# Patient Record
Sex: Male | Born: 1940
Health system: Southern US, Community
[De-identification: ages and names within clinical notes are randomized; demographics above are authoritative.]

## PROBLEM LIST (undated history)

## (undated) DIAGNOSIS — M1990 Unspecified osteoarthritis, unspecified site: Secondary | ICD-10-CM

## (undated) DIAGNOSIS — G459 Transient cerebral ischemic attack, unspecified: Secondary | ICD-10-CM

## (undated) DIAGNOSIS — H3500 Unspecified background retinopathy: Secondary | ICD-10-CM

## (undated) DIAGNOSIS — E119 Type 2 diabetes mellitus without complications: Secondary | ICD-10-CM

## (undated) DIAGNOSIS — R52 Pain, unspecified: Secondary | ICD-10-CM

## (undated) DIAGNOSIS — I639 Cerebral infarction, unspecified: Secondary | ICD-10-CM

## (undated) HISTORY — PX: HERNIA REPAIR: SHX51

## (undated) HISTORY — PX: BACK SURGERY: SHX140

## (undated) HISTORY — DX: Unspecified background retinopathy: H35.00

---

## 2001-03-01 ENCOUNTER — Ambulatory Visit (HOSPITAL_COMMUNITY): Admission: RE | Admit: 2001-03-01 | Discharge: 2001-03-01 | Payer: Self-pay

## 2007-05-09 ENCOUNTER — Emergency Department: Payer: Self-pay | Admitting: Emergency Medicine

## 2016-04-12 ENCOUNTER — Emergency Department: Payer: Medicare Other

## 2016-04-12 ENCOUNTER — Inpatient Hospital Stay
Admission: EM | Admit: 2016-04-12 | Discharge: 2016-04-14 | DRG: 065 | Disposition: A | Discharge status: Home / Self Care | Payer: Medicare Other | Attending: Internal Medicine | Admitting: Internal Medicine

## 2016-04-12 ENCOUNTER — Encounter: Payer: Self-pay | Admitting: Emergency Medicine

## 2016-04-12 DIAGNOSIS — R4781 Slurred speech: Secondary | ICD-10-CM

## 2016-04-12 DIAGNOSIS — G459 Transient cerebral ischemic attack, unspecified: Secondary | ICD-10-CM | POA: Diagnosis present

## 2016-04-12 DIAGNOSIS — Z23 Encounter for immunization: Secondary | ICD-10-CM

## 2016-04-12 DIAGNOSIS — I161 Hypertensive emergency: Secondary | ICD-10-CM | POA: Diagnosis present

## 2016-04-12 DIAGNOSIS — K148 Other diseases of tongue: Secondary | ICD-10-CM | POA: Diagnosis present

## 2016-04-12 DIAGNOSIS — F1020 Alcohol dependence, uncomplicated: Secondary | ICD-10-CM | POA: Diagnosis present

## 2016-04-12 DIAGNOSIS — R262 Difficulty in walking, not elsewhere classified: Secondary | ICD-10-CM

## 2016-04-12 DIAGNOSIS — I6381 Other cerebral infarction due to occlusion or stenosis of small artery: Secondary | ICD-10-CM

## 2016-04-12 DIAGNOSIS — I10 Essential (primary) hypertension: Secondary | ICD-10-CM | POA: Diagnosis present

## 2016-04-12 DIAGNOSIS — E119 Type 2 diabetes mellitus without complications: Secondary | ICD-10-CM | POA: Diagnosis present

## 2016-04-12 DIAGNOSIS — I6302 Cerebral infarction due to thrombosis of basilar artery: Secondary | ICD-10-CM

## 2016-04-12 DIAGNOSIS — R29703 NIHSS score 3: Secondary | ICD-10-CM | POA: Diagnosis present

## 2016-04-12 DIAGNOSIS — R29810 Facial weakness: Secondary | ICD-10-CM

## 2016-04-12 DIAGNOSIS — I639 Cerebral infarction, unspecified: Secondary | ICD-10-CM | POA: Diagnosis not present

## 2016-04-12 LAB — PROTIME-INR
INR: 0.99
Prothrombin Time: 13.1 seconds (ref 11.4–15.2)

## 2016-04-12 LAB — COMPREHENSIVE METABOLIC PANEL
ALBUMIN: 3.8 g/dL (ref 3.5–5.0)
ALK PHOS: 64 U/L (ref 38–126)
ALT: 17 U/L (ref 17–63)
ANION GAP: 9 (ref 5–15)
AST: 20 U/L (ref 15–41)
BILIRUBIN TOTAL: 0.6 mg/dL (ref 0.3–1.2)
BUN: 17 mg/dL (ref 6–20)
CALCIUM: 9.1 mg/dL (ref 8.9–10.3)
CO2: 29 mmol/L (ref 22–32)
CREATININE: 1.14 mg/dL (ref 0.61–1.24)
Chloride: 103 mmol/L (ref 101–111)
GFR calc Af Amer: >60 mL/min (ref >60)
GFR calc non Af Amer: >60 mL/min (ref >60)
GLUCOSE: 198 mg/dL — AB (ref 65–99)
Potassium: 3.6 mmol/L (ref 3.5–5.1)
Sodium: 141 mmol/L (ref 135–145)
TOTAL PROTEIN: 6.8 g/dL (ref 6.5–8.1)

## 2016-04-12 LAB — CBC
HEMATOCRIT: 45.2 % (ref 40.0–52.0)
HEMOGLOBIN: 15.6 g/dL (ref 13.0–18.0)
MCH: 32.9 pg (ref 26.0–34.0)
MCHC: 34.6 g/dL (ref 32.0–36.0)
MCV: 95.2 fL (ref 80.0–100.0)
Platelets: 244 10*3/uL (ref 150–440)
RBC: 4.75 MIL/uL (ref 4.40–5.90)
RDW: 13.3 % (ref 11.5–14.5)
WBC: 6.9 10*3/uL (ref 3.8–10.6)

## 2016-04-12 LAB — APTT: aPTT: 26 seconds (ref 24–36)

## 2016-04-12 LAB — TROPONIN I: Troponin I: <0.03 ng/mL (ref <0.03)

## 2016-04-12 LAB — ETHANOL: Alcohol, Ethyl (B): 5 mg/dL — ABNORMAL HIGH (ref <5)

## 2016-04-12 LAB — GLUCOSE, CAPILLARY: Glucose-Capillary: 193 mg/dL — ABNORMAL HIGH (ref 65–99)

## 2016-04-12 MED ORDER — NITROGLYCERIN 2 % TD OINT
TOPICAL_OINTMENT | TRANSDERMAL | Status: AC
  Administered 2016-04-12: 1 [in_us] via TOPICAL
  Filled 2016-04-12: qty 1

## 2016-04-12 MED ORDER — ASPIRIN 81 MG PO CHEW
324.0000 mg | CHEWABLE_TABLET | Freq: Once | ORAL | Status: AC
Start: 2016-04-12 — End: 2016-04-12
  Administered 2016-04-12: 324 mg via ORAL
  Filled 2016-04-12: qty 4

## 2016-04-12 MED ORDER — ALTEPLASE 100 MG IV SOLR
INTRAVENOUS | Status: AC
  Filled 2016-04-12: qty 100

## 2016-04-12 MED ORDER — NITROGLYCERIN 2 % TD OINT
1.0000 [in_us] | TOPICAL_OINTMENT | Freq: Four times a day (QID) | TRANSDERMAL | Status: DC
  Administered 2016-04-12: 1 [in_us] via TOPICAL

## 2016-04-12 MED ORDER — LORAZEPAM 2 MG/ML IJ SOLN
1.0000 mg | Freq: Once | INTRAMUSCULAR | Status: AC
  Administered 2016-04-12: 1 mg via INTRAVENOUS
  Filled 2016-04-12: qty 1

## 2016-04-12 MED ORDER — LABETALOL HCL 5 MG/ML IV SOLN
10.0000 mg | Freq: Once | INTRAVENOUS | Status: AC
  Administered 2016-04-12: 10 mg via INTRAVENOUS

## 2016-04-12 MED ORDER — NITROGLYCERIN IN D5W 200-5 MCG/ML-% IV SOLN
0.0000 ug/min | INTRAVENOUS | Status: DC
  Administered 2016-04-12: 20 ug/min via INTRAVENOUS
  Filled 2016-04-12: qty 250

## 2016-04-12 MED ORDER — ASPIRIN 300 MG RE SUPP
RECTAL | Status: AC
  Filled 2016-04-12: qty 2

## 2016-04-12 NOTE — ED Provider Notes (Signed)
[facility] Emergency Department Provider Note  ____________________________________________  Time seen: Approximately 10:19 PM  I have reviewed the triage vital signs and the nursing notes.   HISTORY  Chief Complaint Code Stroke    HPI Eric Morris is a 75 y.o. male with a history of alcohol dependence but no other known medical problems presenting for slurred speech, left facial droop. The patient reports that at 6:15 PM he was pumping gas when he was noted to have slurred speech. He denies any other symptoms including headache, visual changes, numbness tingling or weakness, difficulty walking. He reports no alcohol since yesterday. He denies cocaine use. No known trauma. The patient is not anticoagulated. Upon arrival to the emergency department, code stroke was initiated for further assessment of this patient's symptoms.   History reviewed. No pertinent past medical history.  There are no active problems to display for this patient.   Past Surgical History:  Procedure Laterality Date  . BACK SURGERY    . HERNIA REPAIR        Allergies Review of patient's allergies indicates no known allergies.  No family history on file.  Social History Social History  Substance Use Topics  . Smoking status: Never Smoker  . Smokeless tobacco: Never Used  . Alcohol use 2.4 - 3.0 oz/week    4 - 5 Cans of beer per week    Review of Systems Constitutional: No fever/chills.No lightheadedness or syncope. No known trauma. Eyes: No visual changes. No blurred or double vision. ENT: No sore throat. No congestion or rhinorrhea. Cardiovascular: Denies chest pain. Denies palpitations. Respiratory: Denies shortness of breath.  No cough. Gastrointestinal: No abdominal pain.  No nausea, no vomiting.  No diarrhea.  No constipation. Genitourinary: Negative for dysuria. Musculoskeletal: Negative for back pain. Skin: Negative for rash. Neurological: Negative for  headaches. No focal numbness, tingling or weakness. Positive dysarthria. No difficult walking. No changes in vision.  10-point ROS otherwise negative.  ____________________________________________   PHYSICAL EXAM:  VITAL SIGNS: ED Triage Vitals [04/12/16 2214]  Enc Vitals Group     BP      Pulse      Resp      Temp      Temp src      SpO2      Weight 154 lb (69.9 kg)     Height 5\' 10"  (1.778 m)     Head Circumference      Peak Flow      Pain Score      Pain Loc      Pain Edu?      Excl. in [place]?     Constitutional: Alert and oriented. Chronically ill appearing but nontoxic.  Answers questions appropriately with intermittent slurred speech.. Markedly hypertensive. Eyes: Conjunctivae are normal.  EOMI. PERRLA. No scleral icterus. Head: Atraumatic. Nose: No congestion/rhinnorhea. Mouth/Throat: Mucous membranes are moist.  Neck: No stridor.  Supple.  No JVD. No meningismus. Cardiovascular: Normal rate, regular rhythm. No murmurs, rubs or gallops.  Respiratory: Normal respiratory effort.  No accessory muscle use or retractions. Lungs CTAB.  No wheezes, rales or ronchi. Gastrointestinal: Soft, nontender and nondistended.  No guarding or rebound.  No peritoneal signs. Musculoskeletal: No LE edema. No ttp in the calves or palpable cords.  Negative Homan's sign. Neurologic: Alert and oriented 3. Speech is intermittently slurred. Naming of the watch and watchband and repetition of no ifs, ands or buts are intact except for mild slurring. Left facial droop. Left smile asymmetry.  Left tongue deviation.  No pronator drift. 5 out of 5 grip, biceps, triceps, hip flexors, plantar flexion and dorsiflexion. Normal sensation to light touch in the bilateral upper and lower extremities, and face. Normal heel-to-shin. Skin:  Skin is warm, dry and intact. No rash noted. Psychiatric: Mood and affect are normal. Speech and behavior are normal.  Normal  judgement.  ____________________________________________   LABS (all labs ordered are listed, but only abnormal results are displayed)  Labs Reviewed  GLUCOSE, CAPILLARY - Abnormal; Notable for the following:       Result Value   Glucose-Capillary 193 (*)    All other components within normal limits  COMPREHENSIVE METABOLIC PANEL - Abnormal; Notable for the following:    Glucose, Bld 198 (*)    All other components within normal limits  ETHANOL - Abnormal; Notable for the following:    Alcohol, Ethyl (B) 5 (*)    All other components within normal limits  CBC  TROPONIN I  APTT  PROTIME-INR  URINALYSIS COMPLETEWITH MICROSCOPIC ([facility] ONLY)  URINE DRUG SCREEN, QUALITATIVE ([facility] ONLY)   ____________________________________________  EKG  ED ECG REPORT I, Eula Listen, the attending physician, personally viewed and interpreted this ECG.   Date: 04/12/2016  EKG Time: 2214  Rate: 62  Rhythm: normal sinus rhythm, LAFB  Axis: leftward  Intervals:none  ST&T Change: Nonspecific T-wave inversion in V1. No ST elevation.   ____________________________________________  RADIOLOGY  Ct Head Wo Contrast  Result Date: 04/12/2016 CLINICAL DATA:  Slurred speech and dizzy EXAM: CT HEAD WITHOUT CONTRAST TECHNIQUE: Contiguous axial images were obtained from the base of the skull through the vertex without intravenous contrast. COMPARISON:  None. FINDINGS: Brain: No evidence of acute infarction, hemorrhage, hydrocephalus, extra-axial collection or mass lesion/mass effect. Moderate periventricular white matter hypo densities most likely small vessel ischemic change. Old appearing lacune in the left sub insula. Small vessel disease extends into the right ganglial capsular region. Mild atrophy. Vascular: No hyperdense vessels. Mild calcifications in the carotid arteries at the skullbase. Skull: Normal. Negative for fracture or focal lesion. Sinuses/Orbits: Mild mucosal thickening in the  ethmoid sinuses. Orbits appear grossly intact. Other: None IMPRESSION: No CT evidence for acute intracranial abnormality. Moderate periventricular and right ganglial capsular small vessel ischemic changes. Old appearing left sub insular lacune. If continued clinical concern for CVA, MRI follow-up would be recommended. Critical Value/emergent results were called by telephone at the time of interpretation on 04/12/2016 at 10:25 pm to Dr. Mariea Clonts who was covering for Dr. Hinda Kehr , who verbally acknowledged these results. Electronically Signed   By: Donavan Foil M.D.   On: 04/12/2016 22:25   Dg Chest Portable 1 View  Result Date: 04/12/2016 CLINICAL DATA:  Slurred speech with facial droop, chest pain EXAM: PORTABLE CHEST 1 VIEW COMPARISON:  None. FINDINGS: The heart size and mediastinal contours are within normal limits. Both lungs are clear. The visualized skeletal structures are unremarkable. IMPRESSION: No active disease. Electronically Signed   By: Donavan Foil M.D.   On: 04/12/2016 23:20    ____________________________________________   PROCEDURES  Procedure(s) performed: None  Procedures  Critical Care performed: Yes ____________________________________________   INITIAL IMPRESSION / ASSESSMENT AND PLAN / ED COURSE  Pertinent labs & imaging results that were available during my care of the patient were reviewed by me and considered in my medical decision making (see chart for details).  75 y.o. male with alcohol dependence who does not regularly see a physician presenting with slurred speech, left facial droop  and left tongue deviation in the setting of market hypertension. The patient has a CT scan that does not show any acute stroke but does have some microvascular chronic disease. On my examination, the patient has intermittent slurred speech and left facial droop with left tongue deviation. The onset of his symptoms was approximate 6:15 PM which is greater than 4 hours ago. This  is a contraindication for TPA. In addition, the patient is markedly hypertensive which is also a contraindication at this time. I will plan to treat the patient with labetalol, and ordered an MRI for further evaluation. Other possible causes of slurred speech include alcohol intake, withdrawal from alcohol though his last rink was yesterday, atypical migraine. This patient will receive aspirin in the emergency department. He will be admitted to the hospital.  CRITICAL CARE Performed by: Eula Listen   Total critical care time: 40 minutes  Critical care time was exclusive of separately billable procedures and treating other patients.  Critical care was necessary to treat or prevent imminent or life-threatening deterioration.  Critical care was time spent personally by me on the following activities: development of treatment plan with patient and/or surrogate as well as nursing, discussions with consultants, evaluation of patient's response to treatment, examination of patient, obtaining history from patient or surrogate, ordering and performing treatments and interventions, ordering and review of laboratory studies, ordering and review of radiographic studies, pulse oximetry and re-evaluation of patient's condition.  ----------------------------------------- 10:32 PM on 04/12/2016 -----------------------------------------  The patient has been evaluated by Dr. Ron Parker, the patella neurologist on-call. His only finding was 1.4 slurred speech. He also agrees that the patient is not a candidate for TPA at this time, nor would he be a candidate for intra-arterial intervention. We will proceed with management of blood pressure, continued workup for his symptoms, and admission.  ____________________________________________  FINAL CLINICAL IMPRESSION(S) / ED DIAGNOSES  Final diagnoses:  Slurred speech  Facial droop  Tongue deviation  Hypertensive emergency    Clinical Course  Comment By  Time  The patient blood pressure after labetalol improved to the 180s, but then rebounded over 200. I have placed an inch of Nitropaste on his chest, and his current blood pressure is 198/98. His family is here, and his speech has improved on my examination. I'm awaiting the results of his laboratory studies for final disposition. Eula Listen, MD 10/25 2302      NEW MEDICATIONS STARTED DURING THIS VISIT:  New Prescriptions   No medications on file      Eula Listen, MD 04/12/16 2340

## 2016-04-12 NOTE — Progress Notes (Signed)
Pt resting comfortably. Son and daughter in room. Dr's conferring on next steps. CH let family know he is available through the night.   04/12/16 2215  Clinical Encounter Type  Visited With Patient and family together  Visit Type Initial  Referral From Nurse  Spiritual Encounters  Spiritual Needs Emotional  Stress Factors  Patient Stress Factors None identified  Family Stress Factors None identified  Advance Directives (For Healthcare)  Does patient have an advance directive? No  Would patient like information on creating an advanced directive? No - patient declined information

## 2016-04-12 NOTE — ED Notes (Signed)
Dr Mariea Clonts to bedside & report given

## 2016-04-12 NOTE — ED Triage Notes (Signed)
Patient ambulatory to triage with steady gait, accomp by son; pt reports slurred speech; pt placed in w/c and taken immed to CT; charge nurse notified; pt with slurred speech; some left sided facial droop noted; strong & equal grips noted, strong pedal pushes; pt denies c/o pain; st about 615pm was pumping gas and had sudden onset dizziness and slurred speech; pt denies any hx of same

## 2016-04-12 NOTE — ED Notes (Signed)
[facility] neurologlist consult in progress at this time.

## 2016-04-13 ENCOUNTER — Inpatient Hospital Stay
Admit: 2016-04-13 | Discharge: 2016-04-13 | Disposition: A | Discharge status: Home / Self Care | Payer: Medicare Other | Attending: Family Medicine | Admitting: Family Medicine

## 2016-04-13 ENCOUNTER — Inpatient Hospital Stay: Payer: Medicare Other

## 2016-04-13 ENCOUNTER — Encounter: Payer: Self-pay | Admitting: *Deleted

## 2016-04-13 DIAGNOSIS — I161 Hypertensive emergency: Secondary | ICD-10-CM | POA: Diagnosis present

## 2016-04-13 DIAGNOSIS — E119 Type 2 diabetes mellitus without complications: Secondary | ICD-10-CM | POA: Diagnosis present

## 2016-04-13 DIAGNOSIS — Z23 Encounter for immunization: Secondary | ICD-10-CM | POA: Diagnosis not present

## 2016-04-13 DIAGNOSIS — I639 Cerebral infarction, unspecified: Secondary | ICD-10-CM | POA: Diagnosis not present

## 2016-04-13 DIAGNOSIS — R4781 Slurred speech: Secondary | ICD-10-CM | POA: Diagnosis present

## 2016-04-13 DIAGNOSIS — K148 Other diseases of tongue: Secondary | ICD-10-CM | POA: Diagnosis present

## 2016-04-13 DIAGNOSIS — R29703 NIHSS score 3: Secondary | ICD-10-CM | POA: Diagnosis present

## 2016-04-13 DIAGNOSIS — F1020 Alcohol dependence, uncomplicated: Secondary | ICD-10-CM | POA: Diagnosis present

## 2016-04-13 DIAGNOSIS — I10 Essential (primary) hypertension: Secondary | ICD-10-CM | POA: Diagnosis present

## 2016-04-13 LAB — URINE DRUG SCREEN, QUALITATIVE (ARMC ONLY)
AMPHETAMINES, UR SCREEN: NOT DETECTED
Amphetamines, Ur Screen: NOT DETECTED
BARBITURATES, UR SCREEN: NOT DETECTED
Barbiturates, Ur Screen: NOT DETECTED
Benzodiazepine, Ur Scrn: NOT DETECTED
Benzodiazepine, Ur Scrn: NOT DETECTED
CANNABINOID 50 NG, UR ~~LOC~~: NOT DETECTED
CANNABINOID 50 NG, UR ~~LOC~~: NOT DETECTED
COCAINE METABOLITE, UR ~~LOC~~: NOT DETECTED
COCAINE METABOLITE, UR ~~LOC~~: NOT DETECTED
MDMA (ECSTASY) UR SCREEN: NOT DETECTED
MDMA (ECSTASY) UR SCREEN: NOT DETECTED
METHADONE SCREEN, URINE: NOT DETECTED
Methadone Scn, Ur: NOT DETECTED
Opiate, Ur Screen: NOT DETECTED
Opiate, Ur Screen: NOT DETECTED
PHENCYCLIDINE (PCP) UR S: NOT DETECTED
Phencyclidine (PCP) Ur S: NOT DETECTED
TRICYCLIC, UR SCREEN: NOT DETECTED
TRICYCLIC, UR SCREEN: NOT DETECTED

## 2016-04-13 LAB — URINALYSIS COMPLETE WITH MICROSCOPIC (ARMC ONLY)
Bacteria, UA: NONE SEEN
Bilirubin Urine: NEGATIVE
Bilirubin Urine: NEGATIVE
Glucose, UA: >500 mg/dL — AB
Ketones, ur: NEGATIVE mg/dL
LEUKOCYTES UA: NEGATIVE
NITRITE: NEGATIVE
Nitrite: NEGATIVE
PROTEIN: NEGATIVE mg/dL
PROTEIN: NEGATIVE mg/dL
SPECIFIC GRAVITY, URINE: 1.028 (ref 1.005–1.030)
SPECIFIC GRAVITY, URINE: 1.014 (ref 1.005–1.030)
pH: 5 (ref 5.0–8.0)
pH: 7 (ref 5.0–8.0)

## 2016-04-13 LAB — CBC
HEMATOCRIT: 39.7 % — AB (ref 40.0–52.0)
Hemoglobin: 13.8 g/dL (ref 13.0–18.0)
MCH: 33.3 pg (ref 26.0–34.0)
MCHC: 34.8 g/dL (ref 32.0–36.0)
MCV: 95.9 fL (ref 80.0–100.0)
PLATELETS: 214 10*3/uL (ref 150–440)
RBC: 4.14 MIL/uL — AB (ref 4.40–5.90)
RDW: 13.3 % (ref 11.5–14.5)
WBC: 7.3 10*3/uL (ref 3.8–10.6)

## 2016-04-13 LAB — COMPREHENSIVE METABOLIC PANEL
ALK PHOS: 49 U/L (ref 38–126)
ALT: 13 U/L — AB (ref 17–63)
AST: 15 U/L (ref 15–41)
Albumin: 3.3 g/dL — ABNORMAL LOW (ref 3.5–5.0)
Anion gap: 5 (ref 5–15)
BILIRUBIN TOTAL: 0.7 mg/dL (ref 0.3–1.2)
BUN: 17 mg/dL (ref 6–20)
CALCIUM: 8.4 mg/dL — AB (ref 8.9–10.3)
CHLORIDE: 105 mmol/L (ref 101–111)
CO2: 29 mmol/L (ref 22–32)
CREATININE: 1.02 mg/dL (ref 0.61–1.24)
Glucose, Bld: 172 mg/dL — ABNORMAL HIGH (ref 65–99)
Potassium: 3.4 mmol/L — ABNORMAL LOW (ref 3.5–5.1)
Sodium: 139 mmol/L (ref 135–145)
TOTAL PROTEIN: 6 g/dL — AB (ref 6.5–8.1)

## 2016-04-13 LAB — TROPONIN I
Troponin I: <0.03 ng/mL (ref <0.03)
Troponin I: <0.03 ng/mL (ref <0.03)

## 2016-04-13 LAB — GLUCOSE, CAPILLARY
GLUCOSE-CAPILLARY: 207 mg/dL — AB (ref 65–99)
GLUCOSE-CAPILLARY: 165 mg/dL — AB (ref 65–99)
Glucose-Capillary: 181 mg/dL — ABNORMAL HIGH (ref 65–99)

## 2016-04-13 LAB — LIPID PANEL
CHOLESTEROL: 163 mg/dL (ref 0–200)
HDL: 46 mg/dL (ref >40)
LDL Cholesterol: 95 mg/dL (ref 0–99)
TRIGLYCERIDES: 108 mg/dL (ref <150)
Total CHOL/HDL Ratio: 3.5 RATIO
VLDL: 22 mg/dL (ref 0–40)

## 2016-04-13 LAB — MRSA PCR SCREENING: MRSA by PCR: NEGATIVE

## 2016-04-13 MED ORDER — STROKE: EARLY STAGES OF RECOVERY BOOK: Freq: Once | Status: DC

## 2016-04-13 MED ORDER — INFLUENZA VAC SPLIT QUAD 0.5 ML IM SUSY
0.5000 mL | PREFILLED_SYRINGE | INTRAMUSCULAR | Status: AC
  Administered 2016-04-14: 0.5 mL via INTRAMUSCULAR
  Filled 2016-04-13: qty 0.5

## 2016-04-13 MED ORDER — ATORVASTATIN CALCIUM 20 MG PO TABS
40.0000 mg | ORAL_TABLET | Freq: Every day | ORAL | Status: DC
  Administered 2016-04-13 – 2016-04-14 (×2): 40 mg via ORAL
  Filled 2016-04-13 (×2): qty 2

## 2016-04-13 MED ORDER — ACETAMINOPHEN 650 MG RE SUPP: 650.0000 mg | RECTAL | Status: DC | PRN

## 2016-04-13 MED ORDER — ENOXAPARIN SODIUM 40 MG/0.4ML ~~LOC~~ SOLN
40.0000 mg | Freq: Every day | SUBCUTANEOUS | Status: DC
  Administered 2016-04-13 (×2): 40 mg via SUBCUTANEOUS
  Filled 2016-04-13 (×2): qty 0.4

## 2016-04-13 MED ORDER — ASPIRIN EC 81 MG PO TBEC
81.0000 mg | DELAYED_RELEASE_TABLET | Freq: Every day | ORAL | Status: DC
  Administered 2016-04-13 – 2016-04-14 (×2): 81 mg via ORAL
  Filled 2016-04-13 (×2): qty 1

## 2016-04-13 MED ORDER — PNEUMOCOCCAL VAC POLYVALENT 25 MCG/0.5ML IJ INJ
0.5000 mL | INJECTION | INTRAMUSCULAR | Status: AC
  Administered 2016-04-14: 08:00:00 0.5 mL via INTRAMUSCULAR
  Filled 2016-04-13: qty 0.5

## 2016-04-13 MED ORDER — POTASSIUM CHLORIDE CRYS ER 20 MEQ PO TBCR
40.0000 meq | EXTENDED_RELEASE_TABLET | Freq: Once | ORAL | Status: AC
Start: 2016-04-13 — End: 2016-04-13
  Administered 2016-04-13: 40 meq via ORAL
  Filled 2016-04-13: qty 2

## 2016-04-13 MED ORDER — ACETAMINOPHEN 325 MG PO TABS: 650.0000 mg | ORAL_TABLET | ORAL | Status: DC | PRN

## 2016-04-13 MED ORDER — INSULIN ASPART 100 UNIT/ML ~~LOC~~ SOLN
0.0000 [IU] | Freq: Three times a day (TID) | SUBCUTANEOUS | Status: DC
  Administered 2016-04-13 – 2016-04-14 (×2): 3 [IU] via SUBCUTANEOUS
  Administered 2016-04-14: 2 [IU] via SUBCUTANEOUS
  Filled 2016-04-13 (×2): qty 3
  Filled 2016-04-13: qty 2

## 2016-04-13 MED ORDER — GADOBENATE DIMEGLUMINE 529 MG/ML IV SOLN
15.0000 mL | Freq: Once | INTRAVENOUS | Status: AC | PRN
  Administered 2016-04-13: 14 mL via INTRAVENOUS

## 2016-04-13 MED ORDER — SODIUM CHLORIDE 0.9 % IV SOLN
INTRAVENOUS | Status: DC
  Administered 2016-04-13 – 2016-04-14 (×3): via INTRAVENOUS

## 2016-04-13 MED ORDER — SENNOSIDES-DOCUSATE SODIUM 8.6-50 MG PO TABS: 1.0000 | ORAL_TABLET | Freq: Every evening | ORAL | Status: DC | PRN

## 2016-04-13 NOTE — Plan of Care (Signed)
Problem: Education: Goal: Knowledge of patient specific risk factors addressed and post discharge goals established will improve Outcome: Progressing NIH 3. +MRI. Neuro status remains stable. VSS.

## 2016-04-13 NOTE — Care Management Obs Status (Signed)
[place] NOTIFICATION   Patient Details  Name: VERE PELLETT MRN: [id number] Date of Birth: [birth year]   Medicare Observation Status Notification Given:  Yes    Beau Fanny, RN 04/13/2016, 9:12 AM

## 2016-04-13 NOTE — Plan of Care (Signed)
Problem: Education: Goal: Knowledge of disease or condition will improve Outcome: Progressing Patient education on TIAs given to patient

## 2016-04-13 NOTE — Progress Notes (Signed)
SLP Cancellation Note  Patient Details Name: JAZARION MCMANNIS MRN: [id number] DOB: 19-Sep-1940   Cancelled treatment:       Reason Eval/Treat Not Completed: SLP screened, no needs identified, will sign off (no identified needs for Dysphagia. NSG consulted/agreed.) NSG stated pt consumed po's at breakfast meal as well as meds w/out deficits noted.  Pt's speech is c/b low volume and slightly mumbled speech. Pt indicated that low volume was "normal" for him. When he increased his volume he was 100% intelligible w/ no slurred speech noted. Noted he was able to verbally give his menu/meal requests for 2 meals (given multiple choices) w/ Dining services attendant w/out difficulty for pt or the attendant. Will f/u w/ family members and pt tomorrow to determine any speech needs. Pt agreed; NSG agreed.  Orinda Kenner, MS, CCC-SLP  Saturnino Liew 04/13/2016, 10:42 AM

## 2016-04-13 NOTE — Consult Note (Signed)
Referring Physician: Gouru    Chief Complaint: Difficulty with speech  HPI: Eric Morris is an 75 y.o. male who reports that while pumping gas yesterday he noted difficulty with speech. He noted no other symptoms.  Went to his son's and with no improvement in symptoms later presented to the ED.  Patient has a left facial droop as well that he and his son attribute to his previous facial surgery.  Patient was hypertensive on presentation.  Has not seen a physician in quite some time.  Initial NIHSS of 3.    Date last known well: Date: 04/12/2016 Time last known well: Time: 18:15 tPA Given: No: Minimal symptoms  History reviewed. No pertinent past medical history.  Past Surgical History:  Procedure Laterality Date  . BACK SURGERY    . HERNIA REPAIR      Family history: Son alive and well  Social History:  reports that he quit smoking about 10 years ago. His smoking use included Cigarettes. He has a 22.50 pack-year smoking history. His smokeless tobacco use includes Chew. He reports that he drinks about 2.4 - 3.0 oz of alcohol per week . His drug history is not on file.  Allergies: No Known Allergies  Medications:  I have reviewed the patient's current medications. Prior to Admission:  No prescriptions prior to admission.   Scheduled: .  stroke: mapping our early stages of recovery book   Does not apply Once  . aspirin EC  81 mg Oral Daily  . atorvastatin  40 mg Oral q1800  . enoxaparin (LOVENOX) injection  40 mg Subcutaneous QHS  . [START ON 04/14/2016] Influenza vac split quadrivalent PF  0.5 mL Intramuscular Tomorrow-1000  . [START ON 04/14/2016] pneumococcal 23 valent vaccine  0.5 mL Intramuscular Tomorrow-1000    ROS: History obtained from the patient  General ROS: negative for - chills, fatigue, fever, night sweats, weight gain or weight loss Psychological ROS: negative for - behavioral disorder, hallucinations, memory difficulties, mood swings or suicidal  ideation Ophthalmic ROS: negative for - blurry vision, double vision, eye pain or loss of vision ENT ROS: negative for - epistaxis, nasal discharge, oral lesions, sore throat, tinnitus or vertigo Allergy and Immunology ROS: negative for - hives or itchy/watery eyes Hematological and Lymphatic ROS: negative for - bleeding problems, bruising or swollen lymph nodes Endocrine ROS: negative for - galactorrhea, hair pattern changes, polydipsia/polyuria or temperature intolerance Respiratory ROS: negative for - cough, hemoptysis, shortness of breath or wheezing Cardiovascular ROS: negative for - chest pain, dyspnea on exertion, edema or irregular heartbeat Gastrointestinal ROS: negative for - abdominal pain, diarrhea, hematemesis, nausea/vomiting or stool incontinence Genito-Urinary ROS: negative for - dysuria, hematuria, incontinence or urinary frequency/urgency Musculoskeletal ROS: negative for - joint swelling or muscular weakness Neurological ROS: as noted in HPI Dermatological ROS: negative for rash and skin lesion changes  Physical Examination: Blood pressure (!) 167/80, pulse (!) 51, temperature 97.9 F (36.6 C), temperature source Oral, resp. rate 14, height 5\' 10"  (1.778 m), weight 64 kg (141 lb 1.5 oz), SpO2 97 %.  HEENT-  Normocephalic, no lesions, without obvious abnormality.  Normal external eye and conjunctiva.  Normal TM's bilaterally.  Normal auditory canals and external ears. Normal external nose, mucus membranes and septum.  Normal pharynx. Cardiovascular- S1, S2 normal, pulses palpable throughout   Lungs- chest clear, no wheezing, rales, normal symmetric air entry Abdomen- soft, non-tender; bowel sounds normal; no masses,  no organomegaly Extremities- no edema Lymph-no adenopathy palpable Musculoskeletal-no joint tenderness, deformity  or swelling Skin-warm and dry, no hyperpigmentation, vitiligo, or suspicious lesions  Neurological Examination Mental Status: Alert, oriented,  thought content appropriate.  Speech fluent without evidence of aphasia but mild dysarthria.  Able to follow 3 step commands without difficulty. Cranial Nerves: II: Discs flat bilaterally; Visual fields grossly normal, pupils equal, round, reactive to light and accommodation III,IV, VI: ptosis not present, extra-ocular motions intact bilaterally V,VII: left facial droop, facial light touch sensation normal bilaterally VIII: hearing normal bilaterally IX,X: gag reflex present XI: bilateral shoulder shrug XII: midline tongue extension Motor: Right : Upper extremity   5/5    Left:     Upper extremity   5/5  Lower extremity   5/5     Lower extremity   5/5 Tone and bulk:normal tone throughout; no atrophy noted Sensory: Pinprick and light touch intact throughout, bilaterally Deep Tendon Reflexes: 2+ and symmetric with absent AJ's bilaterally Plantars: Right: downgoing   Left: equivical Cerebellar: Normal finger-to-nose and normal heel-to-shin testing bilaterally Gait: not tested due to safety concerns    Laboratory Studies:  Basic Metabolic Panel:  Recent Labs Lab 04/12/16 2216 04/13/16 0340  NA 141 139  K 3.6 3.4*  CL 103 105  CO2 29 29  GLUCOSE 198* 172*  BUN 17 17  CREATININE 1.14 1.02  CALCIUM 9.1 8.4*    Liver Function Tests:  Recent Labs Lab 04/12/16 2216 04/13/16 0340  AST 20 15  ALT 17 13*  ALKPHOS 64 49  BILITOT 0.6 0.7  PROT 6.8 6.0*  ALBUMIN 3.8 3.3*   No results for input(s): LIPASE, AMYLASE in the last 168 hours. No results for input(s): AMMONIA in the last 168 hours.  CBC:  Recent Labs Lab 04/12/16 2216 04/13/16 0340  WBC 6.9 7.3  HGB 15.6 13.8  HCT 45.2 39.7*  MCV 95.2 95.9  PLT 244 214    Cardiac Enzymes:  Recent Labs Lab 04/12/16 2216 04/13/16 0340 04/13/16 0831  TROPONINI <0.03 <0.03 <0.03    BNP: Invalid input(s): POCBNP  CBG:  Recent Labs Lab 04/12/16 2211 04/13/16 0226  GLUCAP 193* 181*    Microbiology: Results  for orders placed or performed during the hospital encounter of 04/12/16  MRSA PCR Screening     Status: None   Collection Time: 04/13/16  2:20 AM  Result Value Ref Range Status   MRSA by PCR NEGATIVE NEGATIVE Final    Comment:        The GeneXpert MRSA Assay (FDA approved for NASAL specimens only), is one component of a comprehensive MRSA colonization surveillance program. It is not intended to diagnose MRSA infection nor to guide or monitor treatment for MRSA infections.     Coagulation Studies:  Recent Labs  04/12/16 2216  LABPROT 13.1  INR 0.99    Urinalysis:  Recent Labs Lab 04/12/16 2349 04/13/16 0651  COLORURINE STRAW* YELLOW*  LABSPEC 1.014 1.028  PHURINE 7.0 5.0  GLUCOSEU >500* >500*  HGBUR 1+* 1+*  BILIRUBINUR NEGATIVE NEGATIVE  KETONESUR NEGATIVE TRACE*  PROTEINUR NEGATIVE NEGATIVE  NITRITE NEGATIVE NEGATIVE  LEUKOCYTESUR TRACE* NEGATIVE    Lipid Panel:    Component Value Date/Time   CHOL 163 04/13/2016 0340   TRIG 108 04/13/2016 0340   HDL 46 04/13/2016 0340   CHOLHDL 3.5 04/13/2016 0340   VLDL 22 04/13/2016 0340   LDLCALC 95 04/13/2016 0340    HgbA1C: No results found for: HGBA1C  Urine Drug Screen:     Component Value Date/Time   LABOPIA NONE DETECTED 04/13/2016 0651  COCAINSCRNUR NONE DETECTED 04/13/2016 0651   LABBENZ NONE DETECTED 04/13/2016 0651   AMPHETMU NONE DETECTED 04/13/2016 0651   THCU NONE DETECTED 04/13/2016 0651   LABBARB NONE DETECTED 04/13/2016 0651    Alcohol Level:  Recent Labs Lab 04/12/16 2216  ETH 5*     Imaging: Ct Head Wo Contrast  Result Date: 04/12/2016 CLINICAL DATA:  Slurred speech and dizzy EXAM: CT HEAD WITHOUT CONTRAST TECHNIQUE: Contiguous axial images were obtained from the base of the skull through the vertex without intravenous contrast. COMPARISON:  None. FINDINGS: Brain: No evidence of acute infarction, hemorrhage, hydrocephalus, extra-axial collection or mass lesion/mass effect. Moderate  periventricular white matter hypo densities most likely small vessel ischemic change. Old appearing lacune in the left sub insula. Small vessel disease extends into the right ganglial capsular region. Mild atrophy. Vascular: No hyperdense vessels. Mild calcifications in the carotid arteries at the skullbase. Skull: Normal. Negative for fracture or focal lesion. Sinuses/Orbits: Mild mucosal thickening in the ethmoid sinuses. Orbits appear grossly intact. Other: None IMPRESSION: No CT evidence for acute intracranial abnormality. Moderate periventricular and right ganglial capsular small vessel ischemic changes. Old appearing left sub insular lacune. If continued clinical concern for CVA, MRI follow-up would be recommended. Critical Value/emergent results were called by telephone at the time of interpretation on 04/12/2016 at 10:25 pm to Dr. Mariea Clonts who was covering for Dr. Hinda Kehr , who verbally acknowledged these results. Electronically Signed   By: Donavan Foil M.D.   On: 04/12/2016 22:25   Mr Jodene Nam Head Wo Contrast  Result Date: 04/13/2016 CLINICAL DATA:  Slurred speech, dizziness LEFT facial droop while pumping gas at 6:15 p.m. EXAM: MRI HEAD WITHOUT CONTRAST MRA HEAD WITHOUT CONTRAST MRA NECK WITHOUT AND WITH CONTRAST TECHNIQUE: Multiplanar, multiecho pulse sequences of the brain and surrounding structures were obtained without intravenous contrast. Angiographic images of the Circle of Willis were obtained using MRA technique without intravenous contrast. Angiographic images of the neck were obtained using MRA technique without and with intravenous contrast. Carotid stenosis measurements (when applicable) are obtained utilizing NASCET criteria, using the distal internal carotid diameter as the denominator. CONTRAST:  15mL MULTIHANCE GADOBENATE DIMEGLUMINE 529 MG/ML IV SOLN COMPARISON:  CT HEAD April 12, 2016 FINDINGS: MRI HEAD FINDINGS BRAIN: 11 x 19 mm reduced diffusion LEFT caudate body to  putaminal/lenticulostriate nucleus. Corresponding low ADC values. Old RIGHT basal ganglia infarct with mineralization, no susceptibility artifact to suggest hemorrhage. Mild ex vacuo dilatation RIGHT lateral ventricle, ventricles and sulci are overall normal for patient's age. Patchy to confluent supratentorial white matter FLAIR T2 hyperintensities without midline shift, mass effect or masses. No abnormal extra-axial fluid collections. VASCULAR: Normal major intracranial vascular flow voids present at skull base. SKULL AND UPPER CERVICAL SPINE: No abnormal sellar expansion. No suspicious calvarial bone marrow signal. Craniocervical junction maintained. SINUSES/ORBITS: Bilateral posterior ethmoid mucosal thickening trace LEFT maxillary sinus mucosal thickening. Mastoid air cells are well aerated. The included ocular globes and orbital contents are non-suspicious. OTHER: Patient is edentulous. MRA HEAD FINDINGS- Mild motion degraded examination. ANTERIOR CIRCULATION: Normal flow related enhancement of the included cervical, petrous, cavernous and supraclinoid internal carotid arteries. Patent anterior communicating artery. Normal flow related enhancement of the anterior and middle cerebral arteries, including distal segments. No large vessel occlusion, high-grade stenosis, abnormal luminal irregularity, aneurysm. POSTERIOR CIRCULATION: LEFT vertebral artery is dominant. Basilar artery is patent, with normal flow related enhancement of the main branch vessels. Normal flow related enhancement of the posterior cerebral arteries. No large vessel occlusion, high-grade  stenosis, abnormal luminal irregularity, aneurysm. MRA NECK FINDINGS Source images and MIP image were reviewed. No T1 shortening along the course of the artery's. The common carotid arteries are widely patent bilaterally. The carotid bifurcation is patent bilaterally and there is no significant carotid stenosis. Both vertebral arteries and patent to the  basilar without significant vertebral stenosis. There is no evidence for atherosclerosis or flow limiting stenosis. IMPRESSION: MRI HEAD: Acute 11 x 19 mm LEFT lenticulostriate/basal ganglia nonhemorrhagic infarct. Old RIGHT basal ganglia infarct. Moderate chronic small vessel ischemic disease. MRA HEAD: Motion degraded examination without emergent large vessel occlusion or high-grade stenosis. MRA NECK: Normal. Electronically Signed   By: Elon Alas M.D.   On: 04/13/2016 02:38   Mr Angiogram Neck W Or Wo Contrast  Result Date: 04/13/2016 CLINICAL DATA:  Slurred speech, dizziness LEFT facial droop while pumping gas at 6:15 p.m. EXAM: MRI HEAD WITHOUT CONTRAST MRA HEAD WITHOUT CONTRAST MRA NECK WITHOUT AND WITH CONTRAST TECHNIQUE: Multiplanar, multiecho pulse sequences of the brain and surrounding structures were obtained without intravenous contrast. Angiographic images of the Circle of Willis were obtained using MRA technique without intravenous contrast. Angiographic images of the neck were obtained using MRA technique without and with intravenous contrast. Carotid stenosis measurements (when applicable) are obtained utilizing NASCET criteria, using the distal internal carotid diameter as the denominator. CONTRAST:  22mL MULTIHANCE GADOBENATE DIMEGLUMINE 529 MG/ML IV SOLN COMPARISON:  CT HEAD April 12, 2016 FINDINGS: MRI HEAD FINDINGS BRAIN: 11 x 19 mm reduced diffusion LEFT caudate body to putaminal/lenticulostriate nucleus. Corresponding low ADC values. Old RIGHT basal ganglia infarct with mineralization, no susceptibility artifact to suggest hemorrhage. Mild ex vacuo dilatation RIGHT lateral ventricle, ventricles and sulci are overall normal for patient's age. Patchy to confluent supratentorial white matter FLAIR T2 hyperintensities without midline shift, mass effect or masses. No abnormal extra-axial fluid collections. VASCULAR: Normal major intracranial vascular flow voids present at skull base.  SKULL AND UPPER CERVICAL SPINE: No abnormal sellar expansion. No suspicious calvarial bone marrow signal. Craniocervical junction maintained. SINUSES/ORBITS: Bilateral posterior ethmoid mucosal thickening trace LEFT maxillary sinus mucosal thickening. Mastoid air cells are well aerated. The included ocular globes and orbital contents are non-suspicious. OTHER: Patient is edentulous. MRA HEAD FINDINGS- Mild motion degraded examination. ANTERIOR CIRCULATION: Normal flow related enhancement of the included cervical, petrous, cavernous and supraclinoid internal carotid arteries. Patent anterior communicating artery. Normal flow related enhancement of the anterior and middle cerebral arteries, including distal segments. No large vessel occlusion, high-grade stenosis, abnormal luminal irregularity, aneurysm. POSTERIOR CIRCULATION: LEFT vertebral artery is dominant. Basilar artery is patent, with normal flow related enhancement of the main branch vessels. Normal flow related enhancement of the posterior cerebral arteries. No large vessel occlusion, high-grade stenosis, abnormal luminal irregularity, aneurysm. MRA NECK FINDINGS Source images and MIP image were reviewed. No T1 shortening along the course of the artery's. The common carotid arteries are widely patent bilaterally. The carotid bifurcation is patent bilaterally and there is no significant carotid stenosis. Both vertebral arteries and patent to the basilar without significant vertebral stenosis. There is no evidence for atherosclerosis or flow limiting stenosis. IMPRESSION: MRI HEAD: Acute 11 x 19 mm LEFT lenticulostriate/basal ganglia nonhemorrhagic infarct. Old RIGHT basal ganglia infarct. Moderate chronic small vessel ischemic disease. MRA HEAD: Motion degraded examination without emergent large vessel occlusion or high-grade stenosis. MRA NECK: Normal. Electronically Signed   By: Elon Alas M.D.   On: 04/13/2016 02:38   Mr Brain Wo Contrast  Result  Date: 04/13/2016 CLINICAL DATA:  Slurred speech, dizziness LEFT facial droop while pumping gas at 6:15 p.m. EXAM: MRI HEAD WITHOUT CONTRAST MRA HEAD WITHOUT CONTRAST MRA NECK WITHOUT AND WITH CONTRAST TECHNIQUE: Multiplanar, multiecho pulse sequences of the brain and surrounding structures were obtained without intravenous contrast. Angiographic images of the Circle of Willis were obtained using MRA technique without intravenous contrast. Angiographic images of the neck were obtained using MRA technique without and with intravenous contrast. Carotid stenosis measurements (when applicable) are obtained utilizing NASCET criteria, using the distal internal carotid diameter as the denominator. CONTRAST:  18mL MULTIHANCE GADOBENATE DIMEGLUMINE 529 MG/ML IV SOLN COMPARISON:  CT HEAD April 12, 2016 FINDINGS: MRI HEAD FINDINGS BRAIN: 11 x 19 mm reduced diffusion LEFT caudate body to putaminal/lenticulostriate nucleus. Corresponding low ADC values. Old RIGHT basal ganglia infarct with mineralization, no susceptibility artifact to suggest hemorrhage. Mild ex vacuo dilatation RIGHT lateral ventricle, ventricles and sulci are overall normal for patient's age. Patchy to confluent supratentorial white matter FLAIR T2 hyperintensities without midline shift, mass effect or masses. No abnormal extra-axial fluid collections. VASCULAR: Normal major intracranial vascular flow voids present at skull base. SKULL AND UPPER CERVICAL SPINE: No abnormal sellar expansion. No suspicious calvarial bone marrow signal. Craniocervical junction maintained. SINUSES/ORBITS: Bilateral posterior ethmoid mucosal thickening trace LEFT maxillary sinus mucosal thickening. Mastoid air cells are well aerated. The included ocular globes and orbital contents are non-suspicious. OTHER: Patient is edentulous. MRA HEAD FINDINGS- Mild motion degraded examination. ANTERIOR CIRCULATION: Normal flow related enhancement of the included cervical, petrous, cavernous  and supraclinoid internal carotid arteries. Patent anterior communicating artery. Normal flow related enhancement of the anterior and middle cerebral arteries, including distal segments. No large vessel occlusion, high-grade stenosis, abnormal luminal irregularity, aneurysm. POSTERIOR CIRCULATION: LEFT vertebral artery is dominant. Basilar artery is patent, with normal flow related enhancement of the main branch vessels. Normal flow related enhancement of the posterior cerebral arteries. No large vessel occlusion, high-grade stenosis, abnormal luminal irregularity, aneurysm. MRA NECK FINDINGS Source images and MIP image were reviewed. No T1 shortening along the course of the artery's. The common carotid arteries are widely patent bilaterally. The carotid bifurcation is patent bilaterally and there is no significant carotid stenosis. Both vertebral arteries and patent to the basilar without significant vertebral stenosis. There is no evidence for atherosclerosis or flow limiting stenosis. IMPRESSION: MRI HEAD: Acute 11 x 19 mm LEFT lenticulostriate/basal ganglia nonhemorrhagic infarct. Old RIGHT basal ganglia infarct. Moderate chronic small vessel ischemic disease. MRA HEAD: Motion degraded examination without emergent large vessel occlusion or high-grade stenosis. MRA NECK: Normal. Electronically Signed   By: Elon Alas M.D.   On: 04/13/2016 02:38   Dg Chest Portable 1 View  Result Date: 04/12/2016 CLINICAL DATA:  Slurred speech with facial droop, chest pain EXAM: PORTABLE CHEST 1 VIEW COMPARISON:  None. FINDINGS: The heart size and mediastinal contours are within normal limits. Both lungs are clear. The visualized skeletal structures are unremarkable. IMPRESSION: No active disease. Electronically Signed   By: Donavan Foil M.D.   On: 04/12/2016 23:20    Assessment: 75 y.o. male with a history of ETOH abuse who presents with dysarthria.  MRI of the brain reviewed and reveals an acute left BG infarct.   MRA shows no significant abnormalities.  Patient on no medications at home.  LDL 95.  Further work up recommended.    Stroke Risk Factors - hypertension  Plan: 1. HgbA1c 2. BP control 3. PT consult, OT consult, Speech consult 4. Echocardiogram 5. Prophylactic therapy-Antiplatelet med: Aspirin - dose  325mg  daily 6. NPO until RN stroke swallow screen 7. Telemetry monitoring 8. Frequent neuro checks 9. Statin for lipid management with target LDL<70.  Alexis Goodell, MD Neurology [phone] 04/13/2016, 12:49 PM

## 2016-04-13 NOTE — Progress Notes (Signed)
Inpatient Diabetes Program Recommendations  AACE/ADA: New Consensus Statement on Inpatient Glycemic Control (2015)  Target Ranges:  Prepandial:   less than 140 mg/dL      Peak postprandial:   less than 180 mg/dL (1-2 hours)      Critically ill patients:  140 - 180 mg/dL   Lab Results  Component Value Date   GLUCAP 181 (H) 04/13/2016    Review of Glycemic Control  Results for YARET, PONT (MRN [id number]) as of 04/13/2016 10:30  Ref. Range 04/12/2016 22:11 04/13/2016 02:26  Glucose-Capillary Latest Ref Range: 65 - 99 mg/dL 193 (H) 181 (H)    Diabetes history: none noted, A1C pending Outpatient Diabetes medications: none Current orders for Inpatient glycemic control: none  Inpatient Diabetes Program Recommendations:  Please consider changing diet to carb modified/heart healthy  Consider Novolog 0-9 units (sensitive correction) tid and Novolog 0-5 units qhs   Gentry Fitz, RN, [place], [place], CDE Diabetes Coordinator Inpatient Diabetes Program  [phone] (Team Pager) [phone] ([place]) 04/13/2016 10:32 AM

## 2016-04-13 NOTE — H&P (Signed)
[place] @ [facility] Admission History and Physical [employer], D.O.  ---------------------------------------------------------------------------------------------------------------------   PATIENT NAME: Eric Morris MR#: [id number] DATE OF BIRTH: [birth year] DATE OF ADMISSION: 04/12/2016 PRIMARY CARE PHYSICIAN: No PCP Per Patient  REQUESTING/REFERRING PHYSICIAN: ED Dr. Mariea Clonts  CHIEF COMPLAINT: Chief Complaint  Patient presents with  . Code Stroke    HISTORY OF PRESENT ILLNESS: [name] is a 75 y.o. male with a known history of Alcohol dependence was in a usual state of health until this evening at 6:15 when he noticed slurred speech and difficulty with facial expressions. Symptoms began about 6:15 while the patient was pumping gas. He reports that he then drove to his son's house which is about 20 minutes away. When symptoms did not resolve after some time he presented to the emergency department. Patient states that he has not seen a doctor in many years. He did have a facial surgery 2 years ago but other than that has not kept up with his healthcare maintenance.  Otherwise there has been no change in status. Patient has been taking medication as prescribed and there has been no recent change in medication or diet.  There has been no recent illness, travel or sick contacts.    Patient denies fevers/chills, weakness, dizziness, chest pain, shortness of breath, N/V/C/D, abdominal pain, dysuria/frequency, changes in mental status.   EMS/ED COURSE:   Patient received aspirin  PAST MEDICAL HISTORY: History reviewed. No pertinent past medical history.  Alcohol dependence  PAST SURGICAL HISTORY: Past Surgical History:  Procedure Laterality Date  . BACK SURGERY    . HERNIA REPAIR        SOCIAL HISTORY: Social History  Substance Use Topics  . Smoking status: Never Smoker  . Smokeless tobacco: Never Used  . Alcohol use 2.4 - 3.0 oz/week    4 - 5 Cans  of beer per week      FAMILY HISTORY: No family history on file.   MEDICATIONS AT HOME: Prior to Admission medications   Not on File      DRUG ALLERGIES: No Known Allergies   REVIEW OF SYSTEMS: CONSTITUTIONAL: No fever/chills, fatigue, weakness, weight gain/loss, headache EYES: No blurry or double vision. ENT: No tinnitus, postnasal drip, redness or soreness of the oropharynx.Positive slurred speech RESPIRATORY: No cough, wheeze, hemoptysis, dyspnea. CARDIOVASCULAR: No chest pain, orthopnea, palpitations, syncope. GASTROINTESTINAL: No nausea, vomiting, constipation, diarrhea, abdominal pain, hematemesis, melena or hematochezia. GENITOURINARY: No dysuria or hematuria. ENDOCRINE: No polyuria or nocturia. No heat or cold intolerance. HEMATOLOGY: No anemia, bruising, bleeding. INTEGUMENTARY: No rashes, ulcers, lesions. MUSCULOSKELETAL: No arthritis, swelling, gout. NEUROLOGIC: No numbness, tingling, weakness or ataxia. No seizure-type activity. Positive slurred speech PSYCHIATRIC: No anxiety, depression, insomnia.  PHYSICAL EXAMINATION: VITAL SIGNS: Blood pressure (!) 153/93, pulse 63, resp. rate 18, height 5\' 10"  (1.778 m), weight 69.9 kg (154 lb), SpO2 97 %.  GENERAL: 75 y.o.-year-old white male patient, well-developed, well-nourished lying in the bed in no acute distress.  Pleasant and cooperative.   HEENT: Head atraumatic, normocephalic. Pupils equal, round, reactive to light and accommodation. No scleral icterus. Extraocular muscles intact. Nares are patent. Oropharynx is clear. Mucus membranes moist. NECK: Supple, full range of motion. No JVD, no bruit heard. No thyroid enlargement, no tenderness, no cervical lymphadenopathy. CHEST: Normal breath sounds bilaterally. No wheezing, rales, rhonchi or crackles. No use of accessory muscles of respiration.  No reproducible chest wall tenderness.  CARDIOVASCULAR: S1, S2 normal. No murmurs, rubs, or gallops. Cap refill <2  seconds.  ABDOMEN: Soft, nontender, nondistended. No rebound, guarding, rigidity. Normoactive bowel sounds present in all four quadrants. No organomegaly or mass. EXTREMITIES: Full range of motion. No pedal edema, cyanosis, or clubbing. NEUROLOGIC: Cranial nerves II through XII are grossly intact with no focal sensorimotor deficit. With the exception of mildly slurred speech. There is loss of the nasolabial fold on the left but no droop, no asymmetry of his smile and no left tongue deviation. Muscle strength 5/5 in all extremities. Sensation intact. Gait not checked. Cerebellar signs are intact. PSYCHIATRIC: The patient is alert and oriented x 3. Normal affect, mood, thought content. SKIN: Warm, dry, and intact without obvious rash, lesion, or ulcer.  LABORATORY PANEL:  CBC  Recent Labs Lab 04/12/16 2216  WBC 6.9  HGB 15.6  HCT 45.2  PLT 244   ----------------------------------------------------------------------------------------------------------------- Chemistries  Recent Labs Lab 04/12/16 2216  NA 141  K 3.6  CL 103  CO2 29  GLUCOSE 198*  BUN 17  CREATININE 1.14  CALCIUM 9.1  AST 20  ALT 17  ALKPHOS 64  BILITOT 0.6   ------------------------------------------------------------------------------------------------------------------ Cardiac Enzymes  Recent Labs Lab 04/12/16 2216  TROPONINI <0.03   ------------------------------------------------------------------------------------------------------------------  RADIOLOGY: Ct Head Wo Contrast  Result Date: 04/12/2016 CLINICAL DATA:  Slurred speech and dizzy EXAM: CT HEAD WITHOUT CONTRAST TECHNIQUE: Contiguous axial images were obtained from the base of the skull through the vertex without intravenous contrast. COMPARISON:  None. FINDINGS: Brain: No evidence of acute infarction, hemorrhage, hydrocephalus, extra-axial collection or mass lesion/mass effect. Moderate periventricular white matter hypo densities most  likely small vessel ischemic change. Old appearing lacune in the left sub insula. Small vessel disease extends into the right ganglial capsular region. Mild atrophy. Vascular: No hyperdense vessels. Mild calcifications in the carotid arteries at the skullbase. Skull: Normal. Negative for fracture or focal lesion. Sinuses/Orbits: Mild mucosal thickening in the ethmoid sinuses. Orbits appear grossly intact. Other: None IMPRESSION: No CT evidence for acute intracranial abnormality. Moderate periventricular and right ganglial capsular small vessel ischemic changes. Old appearing left sub insular lacune. If continued clinical concern for CVA, MRI follow-up would be recommended. Critical Value/emergent results were called by telephone at the time of interpretation on 04/12/2016 at 10:25 pm to Dr. Mariea Clonts who was covering for Dr. Hinda Kehr , who verbally acknowledged these results. Electronically Signed   By: Donavan Foil M.D.   On: 04/12/2016 22:25   Dg Chest Portable 1 View  Result Date: 04/12/2016 CLINICAL DATA:  Slurred speech with facial droop, chest pain EXAM: PORTABLE CHEST 1 VIEW COMPARISON:  None. FINDINGS: The heart size and mediastinal contours are within normal limits. Both lungs are clear. The visualized skeletal structures are unremarkable. IMPRESSION: No active disease. Electronically Signed   By: Donavan Foil M.D.   On: 04/12/2016 23:20    EKG: Normal sinus rhythm at 62 bpm with a left axis, left anterior hemiblock and nonspecific ST and T wave changes  IMPRESSION AND PLAN:  This is a 75 y.o. male with a history of alcohol dependence now being admitted with:  1. TIA rule out CVA -  - Admit to stepdown for telemetry observation and neuro workup including: - Studies: MRA/MRI, Echo, Carotids - Labs: CBC, BMP, Lipids, TFTs, A1C - Nursing: Neurochecks, O2, dysphagia screen, permissive hypertension.  - Consults: Neurology, PT/OT, S/S consults.  - Meds: Daily aspirin 81mg .   - Fluids:  IVNS@75cc /hr.   - Routine DVT Px: with Lovenox, SCDs, early ambulation  2. Hypertensive emergency possibly causing neurologic symptoms-patient's BP  did not respond to Labetolol or nitropaste and is currently on a nitroglycerin drip. We will titrate to allow for permissive hypertension. He'll be monitored in the stepdown unit.   3. History of alcohol dependence. Patient's currently drinking 4-5 cans of beer per week. We'll monitor for signs of alcohol withdrawal and initiate CIWA protocol as needed.   Code Status: Full  All the records are reviewed and case discussed with ED provider. Management plans discussed with the patient and/or family who express understanding and agree with plan of care.   TOTAL TIME TAKING CARE OF THIS PATIENT: 60 minutes.   Marena Witts D.O. on 04/13/2016 at 12:59 AM After 6pm go to [email or web address] - [employer] [facility] Office [phone] CC: Primary care physician; No PCP Per Patient     Note: This dictation was prepared with Dragon dictation along with smaller phrase technology. Any transcriptional errors that result from this process are unintentional.

## 2016-04-13 NOTE — Progress Notes (Signed)
Dr. Estanislado Pandy notified of patient's HR as low as 48 and sustaining in low 50s now; nitro drip turned off to maintain SBP 160-170.  Patient is alert and oriented and asymptomatic with sinus bradycardia.  Bedside nursing stroke swallow screen completed and regular diet ordered for patient. See flowsheet for further details.  Will continue to monitor closely.

## 2016-04-13 NOTE — Progress Notes (Signed)
*  PRELIMINARY RESULTS* Echocardiogram 2D Echocardiogram has been performed.  Eric Morris 04/13/2016, 4:17 PM

## 2016-04-13 NOTE — Progress Notes (Signed)
Dr. Margaretmary Eddy updated on neuro plan of care per Dr. Doy Mince. Patient needs to complete stroke work up before being d/c'd. Dr. Margaretmary Eddy aware and stated that she would cancel d/c order for today. Patient to be transferred to rm 118, report given to [place], Therapist, sports. Patient being transported for carotid US, and then will be taken to new room. Per Dr. Margaretmary Eddy patient okay to transport without nursing staff. Patient's family present in room and updated on plan of care as well. Wilnette Kales

## 2016-04-13 NOTE — Progress Notes (Signed)
Stroke Swallow screen completed in ED; duplicate charting   [id number]  Step #1 Pre-Swallow Screen  R Upper  Breath Sounds Clear  L Upper Breath Sounds Clear  R Lower Breath Sounds Clear  L Lower Breath Sounds Clear

## 2016-04-13 NOTE — ED Notes (Signed)
Pt returned to ED Rm 13 from MRI; pt to be transferred to inpatient unit ATT.

## 2016-04-13 NOTE — Evaluation (Signed)
Occupational Therapy Evaluation Patient Details Name: ORBAN MCCRUDDEN MRN: [id number] DOB: 10-01-1940 Today's Date: 04/13/2016    History of Present Illness 75 y/o male with ETOH dependent who was having slurred speech and generally feeling off.  Son brought him to ER, found to have had a stroke.   Clinical Impression   Pt is 75 y/o male who presents with a basal ganglia stroke. He was living at home alone while his wife was in [place] visiting family and his son Dondi Byram was checking in on him.  Pt is at baseline for completing ADLs but has mild balance deficits and bumped into door of bathroom on his R side after using the toilet with supervision.  He stated that was due to not being familiar with surroundings and feels like he is back to baseline.  Rec using a shower chair at home to prevent falls, remove all 6 throw rugs and have someone supervise all higher level tasks that he completes outside such as cutting back bushes and trees.  PT is recommending outpatient follow up but his son Latanya is not sure if they will be able to get him to and from appointments.  Pt does not appear very motivated to participate in any furhter therapy.  No further OT recommended but supervision at home is recommended which was discussed with his son, Koston Maione.      Follow Up Recommendations  No OT follow up    Equipment Recommendations  Tub/shower seat    Recommendations for Other Services       Precautions / Restrictions Precautions Precautions: Fall Restrictions Weight Bearing Restrictions: No      Mobility Bed Mobility Overal bed mobility: Independent             General bed mobility comments: Pt able to rise with no hesitation or difficulty  Transfers Overall transfer level: Independent Equipment used: None             General transfer comment: Pt able to rise w/o LOBs or unsteadiness    Balance Overall balance assessment: Modified Independent (pt with mild dynamic  unsteadiness)                                          ADL Overall ADL's : Needs assistance/impaired Eating/Feeding: Independent;Set up   Grooming: Wash/dry hands;Wash/dry face;Oral care;Applying deodorant;Brushing hair;Supervision/safety;Set up           Upper Body Dressing : Independent;Set up   Lower Body Dressing: Supervision/safety;Set up Lower Body Dressing Details (indicate cue type and reason): supervised for balance only sitting EOB and standing for pants over hips     Toileting- Clothing Manipulation and Hygiene: Supervision/safety               Vision     Perception     Praxis      Pertinent Vitals/Pain Pain Assessment: No/denies pain     Hand Dominance Right   Extremity/Trunk Assessment Upper Extremity Assessment Upper Extremity Assessment: Overall WFL for tasks assessed   Lower Extremity Assessment Lower Extremity Assessment: Defer to PT evaluation       Communication Communication Communication: No difficulties   Cognition Arousal/Alertness: Awake/alert Behavior During Therapy: WFL for tasks assessed/performed Overall Cognitive Status: Within Functional Limits for tasks assessed  General Comments       Exercises       Shoulder Instructions      Home Living Family/patient expects to be discharged to:: Private residence Living Arrangements: Spouse/significant other (wife in [place] for a few more weeks and then will be home to help) Available Help at Discharge: Family (son Syier present for eval and lives 20 min away) Type of Home: House Home Access: Stairs to enter Technical brewer of Steps: 2 Entrance Stairs-Rails: None Home Layout: One level     Bathroom Shower/Tub: Walk-in shower Shower/tub characteristics: Architectural technologist: Standard Bathroom Accessibility: No   Home Equipment: None   Additional Comments: rec a shower chair to prevent falls      Prior  Functioning/Environment Level of Independence: Independent        Comments: Pt drives, runs his errands and generally is able to take care of himself        OT Problem List:     OT Treatment/Interventions:      OT Goals(Current goals can be found in the care plan section) Acute Rehab OT Goals Patient Stated Goal: go home OT Goal Formulation: With patient/family Time For Goal Achievement: 04/27/16 Potential to Achieve Goals: Good  OT Frequency:     Barriers to D/C:            Co-evaluation              End of Session    Activity Tolerance: Patient tolerated treatment well Patient left: in bed;with call bell/phone within reach;with bed alarm set   Time: [id number] OT Time Calculation (min): 35 min Charges:  OT General Charges $OT Visit: 1 Procedure OT Evaluation $OT Eval Low Complexity: 1 Procedure OT Treatments $Self Care/Home Management : 8-22 mins G-Codes:     Chrys Racer, OTR/L ascom [phone] 04/13/16, 5:07 PM

## 2016-04-13 NOTE — Evaluation (Signed)
Physical Therapy Evaluation Patient Details Name: Eric Morris MRN: [id number] DOB: 17-Oct-1940 Today's Date: 04/13/2016   History of Present Illness  75 y/o male with ETOH dependent who was having slurred speech and generally feeling off.  Son brought him to ER, found to have had a stroke.  Clinical Impression  Pt is able to ambulate around the nurses' station and negotiate up/down steps but did seem to have some minimal hesitancy - though he reports he is essentially at his baseline.  Pt also had minimal R UE coordination issues with testing though he reported no issues with using utensils during meals and overall reports feeling fine and ready to get home.  Pt with mild safety issues, but no overt functional limitations.  Pt did not seem interested in participating with outpatient PT, son reports he will be able to assist PRN at home until pt's wife returns.     Follow Up Recommendations Outpatient PT (pt does not seem overly interested)    Equipment Recommendations       Recommendations for Other Services       Precautions / Restrictions Precautions Precautions: Fall Restrictions Weight Bearing Restrictions: No      Mobility  Bed Mobility Overal bed mobility: Independent             General bed mobility comments: Pt able to rise with no hesitation or difficulty  Transfers Overall transfer level: Independent Equipment used: None             General transfer comment: Pt able to rise w/o LOBs or unsteadiness  Ambulation/Gait Ambulation/Gait assistance: Supervision Ambulation Distance (Feet): 250 Feet Assistive device: None       General Gait Details: Pt shows some mild unsteadiness but reports that he feels his is essentially at his baseline.  Pt with no LOBs and does not need any direct assist.  Stairs Stairs: Yes Stairs assistance: Min guard Stair Management: No rails;One rail Left Number of Stairs: 8 General stair comments: Pt able to negotiate  up/down steps w/o direct assist but when doing so w/o rails he was unsteady descending steps and though he would not admit it he was not overly safe.  Wheelchair Mobility    Modified Rankin (Stroke Patients Only)       Balance Overall balance assessment: Modified Independent (pt with mild dynamic unsteadiness)                                           Pertinent Vitals/Pain Pain Assessment: No/denies pain    Home Living Family/patient expects to be discharged to:: Private residence Living Arrangements: Spouse/significant other (wife will be out of town for a few more weeks) Available Help at Discharge: Family   Home Access: Stairs to enter Entrance Stairs-Rails: None Technical brewer of Steps: 2   Home Equipment: None      Prior Function Level of Independence: Independent         Comments: Pt drives, runs his errands and generally is able to take care of himself     Hand Dominance        Extremity/Trunk Assessment   Upper Extremity Assessment: Overall WFL for tasks assessed (minimal R sided quality of motion/coordination issues)           Lower Extremity Assessment: Overall WFL for tasks assessed         Communication   Communication: No  difficulties (per son, speech is nearly back to baseline)  Cognition Arousal/Alertness: Awake/alert Behavior During Therapy: WFL for tasks assessed/performed Overall Cognitive Status: Within Functional Limits for tasks assessed                      General Comments      Exercises     Assessment/Plan    PT Assessment Patient needs continued PT services  PT Problem List Decreased coordination;Decreased safety awareness;Decreased balance          PT Treatment Interventions Gait training;Functional mobility training;Balance training;Neuromuscular re-education;Therapeutic exercise;Therapeutic activities;Stair training    PT Goals (Current goals can be found in the Care Plan  section)  Acute Rehab PT Goals Patient Stated Goal: go home PT Goal Formulation: With patient/family Time For Goal Achievement: 04/27/16 Potential to Achieve Goals: Good    Frequency 7X/week   Barriers to discharge        Co-evaluation               End of Session Equipment Utilized During Treatment: Gait belt Activity Tolerance: Patient tolerated treatment well Patient left: with bed alarm set;with call bell/phone within reach;with family/visitor present           Time: 1400-1420 PT Time Calculation (min) (ACUTE ONLY): 20 min   Charges:   PT Evaluation $PT Eval Low Complexity: 1 Procedure     PT G CodesKreg Shropshire, DPT 04/13/2016, 3:58 PM

## 2016-04-13 NOTE — Progress Notes (Signed)
[facility] [place] NAME: Eric Morris    MR#:  [id number]  DATE OF BIRTH:  Aug 17, 1940  SUBJECTIVE:  CHIEF COMPLAINT:  Patient is doing fine, still has slurred speech denies any weakness in his extremities. Past bedside swallow evaluation. Discussed with son at bedside.  REVIEW OF SYSTEMS:  CONSTITUTIONAL: No fever, fatigue or weakness.  EYES: No blurred or double vision.  EARS, NOSE, AND THROAT: No tinnitus or ear pain.  RESPIRATORY: No cough, shortness of breath, wheezing or hemoptysis.  CARDIOVASCULAR: No chest pain, orthopnea, edema.  GASTROINTESTINAL: No nausea, vomiting, diarrhea or abdominal pain.  GENITOURINARY: No dysuria, hematuria.  ENDOCRINE: No polyuria, nocturia,  HEMATOLOGY: No anemia, easy bruising or bleeding SKIN: No rash or lesion. MUSCULOSKELETAL: No joint pain or arthritis.   NEUROLOGIC: Reporting slurred speechNo tingling, numbness, weakness.  PSYCHIATRY: No anxiety or depression.   DRUG ALLERGIES:  No Known Allergies  VITALS:  Blood pressure (!) 167/80, pulse (!) 51, temperature 97.9 F (36.6 C), temperature source Oral, resp. rate 14, height 5\' 10"  (1.778 m), weight 64 kg (141 lb 1.5 oz), SpO2 97 %.  PHYSICAL EXAMINATION:  GENERAL:  75 y.o.-year-old patient lying in the bed with no acute distress.  EYES: Pupils equal, round, reactive to light and accommodation. No scleral icterus. Extraocular muscles intact.  HEENT: Head atraumatic, normocephalic. Oropharynx and nasopharynx clear.  NECK:  Supple, no jugular venous distention. No thyroid enlargement, no tenderness.  LUNGS: Normal breath sounds bilaterally, no wheezing, rales,rhonchi or crepitation. No use of accessory muscles of respiration.  CARDIOVASCULAR: S1, S2 normal. No murmurs, rubs, or gallops.  ABDOMEN: Soft, nontender, nondistended. Bowel sounds present. No organomegaly or mass.  EXTREMITIES: No pedal edema, cyanosis, or clubbing.  NEUROLOGIC:  Cranial nerves II through XII are intact. Muscle strength 5/5 in all extremities. Sensation intact. Gait not checked.  PSYCHIATRIC: The patient is alert and oriented x 3.  SKIN: No obvious rash, lesion, or ulcer.    LABORATORY PANEL:   CBC  Recent Labs Lab 04/13/16 0340  WBC 7.3  HGB 13.8  HCT 39.7*  PLT 214   ------------------------------------------------------------------------------------------------------------------  Chemistries   Recent Labs Lab 04/13/16 0340  NA 139  K 3.4*  CL 105  CO2 29  GLUCOSE 172*  BUN 17  CREATININE 1.02  CALCIUM 8.4*  AST 15  ALT 13*  ALKPHOS 49  BILITOT 0.7   ------------------------------------------------------------------------------------------------------------------  Cardiac Enzymes  Recent Labs Lab 04/13/16 0831  TROPONINI <0.03   ------------------------------------------------------------------------------------------------------------------  RADIOLOGY:  Ct Head Wo Contrast  Result Date: 04/12/2016 CLINICAL DATA:  Slurred speech and dizzy EXAM: CT HEAD WITHOUT CONTRAST TECHNIQUE: Contiguous axial images were obtained from the base of the skull through the vertex without intravenous contrast. COMPARISON:  None. FINDINGS: Brain: No evidence of acute infarction, hemorrhage, hydrocephalus, extra-axial collection or mass lesion/mass effect. Moderate periventricular white matter hypo densities most likely small vessel ischemic change. Old appearing lacune in the left sub insula. Small vessel disease extends into the right ganglial capsular region. Mild atrophy. Vascular: No hyperdense vessels. Mild calcifications in the carotid arteries at the skullbase. Skull: Normal. Negative for fracture or focal lesion. Sinuses/Orbits: Mild mucosal thickening in the ethmoid sinuses. Orbits appear grossly intact. Other: None IMPRESSION: No CT evidence for acute intracranial abnormality. Moderate periventricular and right ganglial capsular  small vessel ischemic changes. Old appearing left sub insular lacune. If continued clinical concern for CVA, MRI follow-up would be recommended. Critical Value/emergent results were called by  telephone at the time of interpretation on 04/12/2016 at 10:25 pm to Dr. Mariea Clonts who was covering for Dr. Hinda Kehr , who verbally acknowledged these results. Electronically Signed   By: Donavan Foil M.D.   On: 04/12/2016 22:25   Mr Jodene Nam Head Wo Contrast  Result Date: 04/13/2016 CLINICAL DATA:  Slurred speech, dizziness LEFT facial droop while pumping gas at 6:15 p.m. EXAM: MRI HEAD WITHOUT CONTRAST MRA HEAD WITHOUT CONTRAST MRA NECK WITHOUT AND WITH CONTRAST TECHNIQUE: Multiplanar, multiecho pulse sequences of the brain and surrounding structures were obtained without intravenous contrast. Angiographic images of the Circle of Willis were obtained using MRA technique without intravenous contrast. Angiographic images of the neck were obtained using MRA technique without and with intravenous contrast. Carotid stenosis measurements (when applicable) are obtained utilizing NASCET criteria, using the distal internal carotid diameter as the denominator. CONTRAST:  69mL MULTIHANCE GADOBENATE DIMEGLUMINE 529 MG/ML IV SOLN COMPARISON:  CT HEAD April 12, 2016 FINDINGS: MRI HEAD FINDINGS BRAIN: 11 x 19 mm reduced diffusion LEFT caudate body to putaminal/lenticulostriate nucleus. Corresponding low ADC values. Old RIGHT basal ganglia infarct with mineralization, no susceptibility artifact to suggest hemorrhage. Mild ex vacuo dilatation RIGHT lateral ventricle, ventricles and sulci are overall normal for patient's age. Patchy to confluent supratentorial white matter FLAIR T2 hyperintensities without midline shift, mass effect or masses. No abnormal extra-axial fluid collections. VASCULAR: Normal major intracranial vascular flow voids present at skull base. SKULL AND UPPER CERVICAL SPINE: No abnormal sellar expansion. No suspicious  calvarial bone marrow signal. Craniocervical junction maintained. SINUSES/ORBITS: Bilateral posterior ethmoid mucosal thickening trace LEFT maxillary sinus mucosal thickening. Mastoid air cells are well aerated. The included ocular globes and orbital contents are non-suspicious. OTHER: Patient is edentulous. MRA HEAD FINDINGS- Mild motion degraded examination. ANTERIOR CIRCULATION: Normal flow related enhancement of the included cervical, petrous, cavernous and supraclinoid internal carotid arteries. Patent anterior communicating artery. Normal flow related enhancement of the anterior and middle cerebral arteries, including distal segments. No large vessel occlusion, high-grade stenosis, abnormal luminal irregularity, aneurysm. POSTERIOR CIRCULATION: LEFT vertebral artery is dominant. Basilar artery is patent, with normal flow related enhancement of the main branch vessels. Normal flow related enhancement of the posterior cerebral arteries. No large vessel occlusion, high-grade stenosis, abnormal luminal irregularity, aneurysm. MRA NECK FINDINGS Source images and MIP image were reviewed. No T1 shortening along the course of the artery's. The common carotid arteries are widely patent bilaterally. The carotid bifurcation is patent bilaterally and there is no significant carotid stenosis. Both vertebral arteries and patent to the basilar without significant vertebral stenosis. There is no evidence for atherosclerosis or flow limiting stenosis. IMPRESSION: MRI HEAD: Acute 11 x 19 mm LEFT lenticulostriate/basal ganglia nonhemorrhagic infarct. Old RIGHT basal ganglia infarct. Moderate chronic small vessel ischemic disease. MRA HEAD: Motion degraded examination without emergent large vessel occlusion or high-grade stenosis. MRA NECK: Normal. Electronically Signed   By: Elon Alas M.D.   On: 04/13/2016 02:38   Mr Angiogram Neck W Or Wo Contrast  Result Date: 04/13/2016 CLINICAL DATA:  Slurred speech, dizziness  LEFT facial droop while pumping gas at 6:15 p.m. EXAM: MRI HEAD WITHOUT CONTRAST MRA HEAD WITHOUT CONTRAST MRA NECK WITHOUT AND WITH CONTRAST TECHNIQUE: Multiplanar, multiecho pulse sequences of the brain and surrounding structures were obtained without intravenous contrast. Angiographic images of the Circle of Willis were obtained using MRA technique without intravenous contrast. Angiographic images of the neck were obtained using MRA technique without and with intravenous contrast. Carotid stenosis measurements (when applicable) are  obtained utilizing NASCET criteria, using the distal internal carotid diameter as the denominator. CONTRAST:  29mL MULTIHANCE GADOBENATE DIMEGLUMINE 529 MG/ML IV SOLN COMPARISON:  CT HEAD April 12, 2016 FINDINGS: MRI HEAD FINDINGS BRAIN: 11 x 19 mm reduced diffusion LEFT caudate body to putaminal/lenticulostriate nucleus. Corresponding low ADC values. Old RIGHT basal ganglia infarct with mineralization, no susceptibility artifact to suggest hemorrhage. Mild ex vacuo dilatation RIGHT lateral ventricle, ventricles and sulci are overall normal for patient's age. Patchy to confluent supratentorial white matter FLAIR T2 hyperintensities without midline shift, mass effect or masses. No abnormal extra-axial fluid collections. VASCULAR: Normal major intracranial vascular flow voids present at skull base. SKULL AND UPPER CERVICAL SPINE: No abnormal sellar expansion. No suspicious calvarial bone marrow signal. Craniocervical junction maintained. SINUSES/ORBITS: Bilateral posterior ethmoid mucosal thickening trace LEFT maxillary sinus mucosal thickening. Mastoid air cells are well aerated. The included ocular globes and orbital contents are non-suspicious. OTHER: Patient is edentulous. MRA HEAD FINDINGS- Mild motion degraded examination. ANTERIOR CIRCULATION: Normal flow related enhancement of the included cervical, petrous, cavernous and supraclinoid internal carotid arteries. Patent anterior  communicating artery. Normal flow related enhancement of the anterior and middle cerebral arteries, including distal segments. No large vessel occlusion, high-grade stenosis, abnormal luminal irregularity, aneurysm. POSTERIOR CIRCULATION: LEFT vertebral artery is dominant. Basilar artery is patent, with normal flow related enhancement of the main branch vessels. Normal flow related enhancement of the posterior cerebral arteries. No large vessel occlusion, high-grade stenosis, abnormal luminal irregularity, aneurysm. MRA NECK FINDINGS Source images and MIP image were reviewed. No T1 shortening along the course of the artery's. The common carotid arteries are widely patent bilaterally. The carotid bifurcation is patent bilaterally and there is no significant carotid stenosis. Both vertebral arteries and patent to the basilar without significant vertebral stenosis. There is no evidence for atherosclerosis or flow limiting stenosis. IMPRESSION: MRI HEAD: Acute 11 x 19 mm LEFT lenticulostriate/basal ganglia nonhemorrhagic infarct. Old RIGHT basal ganglia infarct. Moderate chronic small vessel ischemic disease. MRA HEAD: Motion degraded examination without emergent large vessel occlusion or high-grade stenosis. MRA NECK: Normal. Electronically Signed   By: Elon Alas M.D.   On: 04/13/2016 02:38   Mr Brain Wo Contrast  Result Date: 04/13/2016 CLINICAL DATA:  Slurred speech, dizziness LEFT facial droop while pumping gas at 6:15 p.m. EXAM: MRI HEAD WITHOUT CONTRAST MRA HEAD WITHOUT CONTRAST MRA NECK WITHOUT AND WITH CONTRAST TECHNIQUE: Multiplanar, multiecho pulse sequences of the brain and surrounding structures were obtained without intravenous contrast. Angiographic images of the Circle of Willis were obtained using MRA technique without intravenous contrast. Angiographic images of the neck were obtained using MRA technique without and with intravenous contrast. Carotid stenosis measurements (when applicable)  are obtained utilizing NASCET criteria, using the distal internal carotid diameter as the denominator. CONTRAST:  27mL MULTIHANCE GADOBENATE DIMEGLUMINE 529 MG/ML IV SOLN COMPARISON:  CT HEAD April 12, 2016 FINDINGS: MRI HEAD FINDINGS BRAIN: 11 x 19 mm reduced diffusion LEFT caudate body to putaminal/lenticulostriate nucleus. Corresponding low ADC values. Old RIGHT basal ganglia infarct with mineralization, no susceptibility artifact to suggest hemorrhage. Mild ex vacuo dilatation RIGHT lateral ventricle, ventricles and sulci are overall normal for patient's age. Patchy to confluent supratentorial white matter FLAIR T2 hyperintensities without midline shift, mass effect or masses. No abnormal extra-axial fluid collections. VASCULAR: Normal major intracranial vascular flow voids present at skull base. SKULL AND UPPER CERVICAL SPINE: No abnormal sellar expansion. No suspicious calvarial bone marrow signal. Craniocervical junction maintained. SINUSES/ORBITS: Bilateral posterior ethmoid mucosal thickening trace  LEFT maxillary sinus mucosal thickening. Mastoid air cells are well aerated. The included ocular globes and orbital contents are non-suspicious. OTHER: Patient is edentulous. MRA HEAD FINDINGS- Mild motion degraded examination. ANTERIOR CIRCULATION: Normal flow related enhancement of the included cervical, petrous, cavernous and supraclinoid internal carotid arteries. Patent anterior communicating artery. Normal flow related enhancement of the anterior and middle cerebral arteries, including distal segments. No large vessel occlusion, high-grade stenosis, abnormal luminal irregularity, aneurysm. POSTERIOR CIRCULATION: LEFT vertebral artery is dominant. Basilar artery is patent, with normal flow related enhancement of the main branch vessels. Normal flow related enhancement of the posterior cerebral arteries. No large vessel occlusion, high-grade stenosis, abnormal luminal irregularity, aneurysm. MRA NECK  FINDINGS Source images and MIP image were reviewed. No T1 shortening along the course of the artery's. The common carotid arteries are widely patent bilaterally. The carotid bifurcation is patent bilaterally and there is no significant carotid stenosis. Both vertebral arteries and patent to the basilar without significant vertebral stenosis. There is no evidence for atherosclerosis or flow limiting stenosis. IMPRESSION: MRI HEAD: Acute 11 x 19 mm LEFT lenticulostriate/basal ganglia nonhemorrhagic infarct. Old RIGHT basal ganglia infarct. Moderate chronic small vessel ischemic disease. MRA HEAD: Motion degraded examination without emergent large vessel occlusion or high-grade stenosis. MRA NECK: Normal. Electronically Signed   By: Elon Alas M.D.   On: 04/13/2016 02:38   Dg Chest Portable 1 View  Result Date: 04/12/2016 CLINICAL DATA:  Slurred speech with facial droop, chest pain EXAM: PORTABLE CHEST 1 VIEW COMPARISON:  None. FINDINGS: The heart size and mediastinal contours are within normal limits. Both lungs are clear. The visualized skeletal structures are unremarkable. IMPRESSION: No active disease. Electronically Signed   By: Donavan Foil M.D.   On: 04/12/2016 23:20    EKG:  No orders found for this or any previous visit.  ASSESSMENT AND PLAN:   This is a 75 y.o. male with a history of alcohol dependence now being admitted with:  1. Acute  CVA - acute left basal gangliar infarct MRI with acute left basal gangliar infarct Pending  Echo, Carotids Patient passed bedside swallow evaluation and tolerating by mouth  Lipids-LDL 95-Start the patient on high intensity Lipitor 40 mg once daily  TFTs  Neurochecks, O2, -  PT/OT, S/S consultspending  - Meds: Daily aspirin 81mg .   Neurology consult pending  2. Hypertensive emergency possibly causing neurologic symptoms-patient's BP did not respond to Labetolol or nitropaste and is currently on a nitroglycerin drip. We will titrate to allow  for permissive hypertension. He'll be monitored in the stepdown unit.   3. History of alcohol dependence.  counselled to stop drinking  Patient's currently drinking 4-5 cans of beer per week.  We'll monitor for signs of alcohol withdrawal and initiate CIWA protocol as needed.    4.Glucosuria Urine glucose greater than 500  patient denies any history of diabetes   hemoglobin A1c-Pending  DVT Px: with Lovenox, SCDs, early ambulation  All the records are reviewed and case discussed with Care Management/Social Workerr. Management plans discussed with the patient, family and they are in agreement.  CODE STATUS: fc  TOTAL TIME TAKING CARE OF THIS PATIENT:  minutes.   POSSIBLE D/C IN 1 DAYS, DEPENDING ON CLINICAL CONDITION.  Note: This dictation was prepared with Dragon dictation along with smaller phrase technology. Any transcriptional errors that result from this process are unintentional.   Nicholes Mango M.D on 04/13/2016 at 12:35 PM  Between 7am to 6pm - Pager - [phone] After 6pm  go to [email or web address] - password [email or web address] [facility]  [facility]  Office  [phone]  CC: Primary care physician; No PCP Per Patient

## 2016-04-14 LAB — BASIC METABOLIC PANEL
ANION GAP: 6 (ref 5–15)
BUN: 14 mg/dL (ref 6–20)
CALCIUM: 8.7 mg/dL — AB (ref 8.9–10.3)
CO2: 27 mmol/L (ref 22–32)
Chloride: 105 mmol/L (ref 101–111)
Creatinine, Ser: 1.03 mg/dL (ref 0.61–1.24)
Glucose, Bld: 163 mg/dL — ABNORMAL HIGH (ref 65–99)
Potassium: 3.6 mmol/L (ref 3.5–5.1)
Sodium: 138 mmol/L (ref 135–145)

## 2016-04-14 LAB — ECHOCARDIOGRAM COMPLETE
HEIGHTINCHES: 70 in
WEIGHTICAEL: 2424 [oz_av]

## 2016-04-14 LAB — GLUCOSE, CAPILLARY
GLUCOSE-CAPILLARY: 205 mg/dL — AB (ref 65–99)
GLUCOSE-CAPILLARY: 137 mg/dL — AB (ref 65–99)
Glucose-Capillary: 155 mg/dL — ABNORMAL HIGH (ref 65–99)

## 2016-04-14 LAB — CBC
HCT: 42.3 % (ref 40.0–52.0)
HEMOGLOBIN: 14.4 g/dL (ref 13.0–18.0)
MCH: 32.6 pg (ref 26.0–34.0)
MCHC: 34 g/dL (ref 32.0–36.0)
MCV: 96 fL (ref 80.0–100.0)
Platelets: 212 10*3/uL (ref 150–440)
RBC: 4.4 MIL/uL (ref 4.40–5.90)
RDW: 13.2 % (ref 11.5–14.5)
WBC: 7.6 10*3/uL (ref 3.8–10.6)

## 2016-04-14 LAB — TROPONIN I

## 2016-04-14 LAB — HEMOGLOBIN A1C
Hgb A1c MFr Bld: 7.7 % — ABNORMAL HIGH (ref 4.8–5.6)
MEAN PLASMA GLUCOSE: 174 mg/dL

## 2016-04-14 MED ORDER — ASPIRIN 81 MG PO CHEW
162.0000 mg | CHEWABLE_TABLET | Freq: Once | ORAL | Status: AC
  Administered 2016-04-14: 162 mg via ORAL
  Filled 2016-04-14: qty 2

## 2016-04-14 MED ORDER — ACETAMINOPHEN 325 MG PO TABS: 650.0000 mg | ORAL_TABLET | ORAL | Status: DC | PRN

## 2016-04-14 MED ORDER — ASPIRIN EC 325 MG PO TBEC: 325.0000 mg | DELAYED_RELEASE_TABLET | Freq: Every day | ORAL | Status: DC

## 2016-04-14 MED ORDER — LIVING WELL WITH DIABETES BOOK
Freq: Once | Status: AC
  Administered 2016-04-14: 15:00:00
  Filled 2016-04-14: qty 1

## 2016-04-14 MED ORDER — METFORMIN HCL 500 MG PO TABS
500.0000 mg | ORAL_TABLET | Freq: Two times a day (BID) | ORAL | Status: DC
  Administered 2016-04-14: 500 mg via ORAL
  Filled 2016-04-14: qty 1

## 2016-04-14 MED ORDER — ASPIRIN 325 MG PO TBEC: 325.0000 mg | DELAYED_RELEASE_TABLET | Freq: Every day | ORAL | 0 refills | Status: DC

## 2016-04-14 MED ORDER — LISINOPRIL 5 MG PO TABS
5.0000 mg | ORAL_TABLET | Freq: Once | ORAL | Status: AC
  Administered 2016-04-14: 5 mg via ORAL
  Filled 2016-04-14: qty 1

## 2016-04-14 MED ORDER — ATORVASTATIN CALCIUM 40 MG PO TABS: 40.0000 mg | ORAL_TABLET | Freq: Every day | ORAL | 0 refills | Status: DC

## 2016-04-14 MED ORDER — LISINOPRIL 5 MG PO TABS: 10.0000 mg | ORAL_TABLET | Freq: Every day | ORAL | 0 refills | Status: DC

## 2016-04-14 MED ORDER — LISINOPRIL 5 MG PO TABS
5.0000 mg | ORAL_TABLET | Freq: Every day | ORAL | Status: DC
  Administered 2016-04-14: 5 mg via ORAL
  Filled 2016-04-14: qty 1

## 2016-04-14 MED ORDER — METFORMIN HCL 500 MG PO TABS: 500.0000 mg | ORAL_TABLET | Freq: Two times a day (BID) | ORAL | 0 refills | Status: DC

## 2016-04-14 NOTE — Progress Notes (Signed)
Dr. Margaretmary Eddy notified BP remains elevated (1341: 165/52) after starting Lisinopril 5mg  at 12:17pm. Verbal order read back and verified for extra 5mg  dose of Lisinopril one time to lower BP before anticipated discharge this afternoon.

## 2016-04-14 NOTE — Care Management Important Message (Signed)
Important Message  Patient Details  Name: Eric Morris MRN: [id number] Date of Birth: 03-Mar-1941   Medicare Important Message Given:  Yes    Shelbie Ammons, RN 04/14/2016, 10:15 AM

## 2016-04-14 NOTE — Discharge Summary (Signed)
[facility] NAME: Eric Morris    MR#:  [id number]  DATE OF BIRTH:  [birth year]  DATE OF ADMISSION:  04/12/2016 ADMITTING PHYSICIAN: Harvie Bridge, DO  DATE OF DISCHARGE: 04/14/16  PRIMARY CARE PHYSICIAN: No PCP Per Patient    ADMISSION DIAGNOSIS:  Slurred speech [R47.81] Facial droop [R29.810] Hypertensive emergency [I16.1] Tongue deviation [K14.8]  DISCHARGE DIAGNOSIS:  Acute CVA  SECONDARY DIAGNOSIS:  History reviewed. No pertinent past medical history.  HOSPITAL COURSE:   This is a 75 y.o.malewith a history of alcohol dependencenow being admitted with:  1. Acute  CVA - acute left basal gangliar infarct MRI with acute left basal gangliar infarct   Echo normal study no wall motion abnormalities noted Carotids= Less than 50% stenosis Patient passed bedside swallow evaluation and tolerating by mouth  Lipids-LDL 95-Start the patient on high intensity Lipitor 40 mg once daily  TFTs  Neurochecks, O2, -  PT/OT, S/S-outpatient PT recommended. No OT needs identified. Patient passed bedside swallow evaluation - Meds: Daily aspirin 325mg .  Neurology Recommendations appreciated. Outpatient follow-up with neurology 2-4 weeks   2. Hypertensive emergency possibly causing neurologic symptoms-patient's BP did not respond to Labetolol or nitropaste and was given nitroglycerin drip. Patient is started on lisinopril 10 mg once daily Discontinued IV fluids   3. History of alcohol dependence.  counselled to stop drinking  Patient's currently drinking 4-5 cans of beer per week.  OP AA clinic  4.Glucosuria-new onset diabetes mellitus-hemoglobin A1c 7.7 Carb controlled diet Start patient on metformin Diabetic diet education provided hemoglobin A1c-7.7  DVT Px: with Lovenox, SCDs, early ambulation  DISCHARGE CONDITIONS:   fair  CONSULTS OBTAINED:  Treatment Team:  Alexis Goodell, MD   PROCEDURES  none  DRUG ALLERGIES:  No Known Allergies  DISCHARGE MEDICATIONS:   Current Discharge Medication List    START taking these medications   Details  acetaminophen (TYLENOL) 325 MG tablet Take 2 tablets (650 mg total) by mouth every 4 (four) hours as needed for mild pain (or temp >/= 99.5 F).    aspirin EC 325 MG EC tablet Take 1 tablet (325 mg total) by mouth daily. Qty: 30 tablet, Refills: 0    atorvastatin (LIPITOR) 40 MG tablet Take 1 tablet (40 mg total) by mouth daily at 6 PM. Qty: 30 tablet, Refills: 0    lisinopril (PRINIVIL,ZESTRIL) 5 MG tablet Take 2 tablets (10 mg total) by mouth daily. Qty: 60 tablet, Refills: 0    metFORMIN (GLUCOPHAGE) 500 MG tablet Take 1 tablet (500 mg total) by mouth 2 (two) times daily with a meal. Qty: 60 tablet, Refills: 0         DISCHARGE INSTRUCTIONS:   Activity as tolerated, outpatient PT Diabetic and cardiac diet Follow-[facility] Neurology follow-up with Dr. Manuella Ghazi in 2 weeks    DIET:  Cardiac and carb controlled diet  DISCHARGE CONDITION:  Fair  ACTIVITY:  Activity as tolerated  OXYGEN:  Home Oxygen: No.   Oxygen Delivery: room air  DISCHARGE LOCATION:  home   If you experience worsening of your admission symptoms, develop shortness of breath, life threatening emergency, suicidal or homicidal thoughts you must seek medical attention immediately by calling 911 or calling your MD immediately  if symptoms less severe.  You Must read complete instructions/literature along with all the possible adverse reactions/side effects for all the Medicines you take and that have been prescribed to you.  Take any new Medicines after you have completely understood and accpet all the possible adverse reactions/side effects.   Please note  You were cared for by a hospitalist during your hospital stay. If you have any questions about your discharge medications or the care you received while  you were in the hospital after you are discharged, you can call the unit and asked to speak with the hospitalist on call if the hospitalist that took care of you is not available. Once you are discharged, your primary care physician will handle any further medical issues. Please note that NO REFILLS for any discharge medications will be authorized once you are discharged, as it is imperative that you return to your primary care physician (or establish a relationship with a primary care physician if you do not have one) for your aftercare needs so that they can reassess your need for medications and monitor your lab values.     Today  Chief Complaint  Patient presents with  . Code Stroke   Patient is resting comfortably. Slurry speech is getting better. Denies any blurry vision. Wants to go home  ROS:  CONSTITUTIONAL: Denies fevers, chills. Denies any fatigue, weakness.  EYES: Denies blurry vision, double vision, eye pain. EARS, NOSE, THROAT: Denies tinnitus, ear pain, hearing loss. RESPIRATORY: Denies cough, wheeze, shortness of breath.  CARDIOVASCULAR: Denies chest pain, palpitations, edema.  GASTROINTESTINAL: Denies nausea, vomiting, diarrhea, abdominal pain. Denies bright red blood per rectum. GENITOURINARY: Denies dysuria, hematuria. ENDOCRINE: Denies nocturia or thyroid problems. HEMATOLOGIC AND LYMPHATIC: Denies easy bruising or bleeding. SKIN: Denies rash or lesion. MUSCULOSKELETAL: Denies pain in neck, back, shoulder, knees, hips or arthritic symptoms.  NEUROLOGIC: Denies paralysis, paresthesias.  PSYCHIATRIC: Denies anxiety or depressive symptoms.   VITAL SIGNS:  Blood pressure (!) 138/58, pulse 64, temperature 98.2 F (36.8 C), temperature source Oral, resp. rate 18, height 5\' 10"  (1.778 m), weight 68.7 kg (151 lb 8 oz), SpO2 97 %.  I/O:    Intake/Output Summary (Last 24 hours) at 04/14/16 1619 Last data filed at 04/14/16 1346  Gross per 24 hour  Intake            903.75 ml  Output             1900 ml  Net          -996.25 ml    PHYSICAL EXAMINATION:  GENERAL:  75 y.o.-year-old patient lying in the bed with no acute distress.  EYES: Pupils equal, round, reactive to light and accommodation. No scleral icterus. Extraocular muscles intact.  HEENT: Head atraumatic, normocephalic. Oropharynx and nasopharynx clear.  NECK:  Supple, no jugular venous distention. No thyroid enlargement, no tenderness.  LUNGS: Normal breath sounds bilaterally, no wheezing, rales,rhonchi or crepitation. No use of accessory muscles of respiration.  CARDIOVASCULAR: S1, S2 normal. No murmurs, rubs, or gallops.  ABDOMEN: Soft, non-tender, non-distended. Bowel sounds present. No organomegaly or mass.  EXTREMITIES: No pedal edema, cyanosis, or clubbing.  NEUROLOGIC: Cranial nerves II through XII are intact. Muscle strength 5/5 in all extremities. Sensation intact. Gait not checked.  PSYCHIATRIC: The patient is alert and oriented x 3.  SKIN: No obvious rash, lesion, or ulcer.   DATA REVIEW:   CBC  Recent Labs Lab 04/14/16 0253  WBC 7.6  HGB 14.4  HCT 42.3  PLT 212    Chemistries   Recent Labs Lab 04/13/16 0340 04/14/16 0253  NA 139 138  K 3.4* 3.6  CL 105 105  CO2 29 27  GLUCOSE 172* 163*  BUN 17 14  CREATININE 1.02 1.03  CALCIUM 8.4* 8.7*  AST 15  --   ALT 13*  --   ALKPHOS 49  --   BILITOT 0.7  --     Cardiac Enzymes  Recent Labs Lab 04/14/16 1406  TROPONINI <0.03    Microbiology Results  Results for orders placed or performed during the hospital encounter of 04/12/16  MRSA PCR Screening     Status: None   Collection Time: 04/13/16  2:20 AM  Result Value Ref Range Status   MRSA by PCR NEGATIVE NEGATIVE Final    Comment:        The GeneXpert MRSA Assay (FDA approved for NASAL specimens only), is one component of a comprehensive MRSA colonization surveillance program. It is not intended to diagnose MRSA infection nor to guide or monitor  treatment for MRSA infections.     RADIOLOGY:  Ct Head Wo Contrast  Result Date: 04/12/2016 CLINICAL DATA:  Slurred speech and dizzy EXAM: CT HEAD WITHOUT CONTRAST TECHNIQUE: Contiguous axial images were obtained from the base of the skull through the vertex without intravenous contrast. COMPARISON:  None. FINDINGS: Brain: No evidence of acute infarction, hemorrhage, hydrocephalus, extra-axial collection or mass lesion/mass effect. Moderate periventricular white matter hypo densities most likely small vessel ischemic change. Old appearing lacune in the left sub insula. Small vessel disease extends into the right ganglial capsular region. Mild atrophy. Vascular: No hyperdense vessels. Mild calcifications in the carotid arteries at the skullbase. Skull: Normal. Negative for fracture or focal lesion. Sinuses/Orbits: Mild mucosal thickening in the ethmoid sinuses. Orbits appear grossly intact. Other: None IMPRESSION: No CT evidence for acute intracranial abnormality. Moderate periventricular and right ganglial capsular small vessel ischemic changes. Old appearing left sub insular lacune. If continued clinical concern for CVA, MRI follow-up would be recommended. Critical Value/emergent results were called by telephone at the time of interpretation on 04/12/2016 at 10:25 pm to Dr. Mariea Clonts who was covering for Dr. Hinda Kehr , who verbally acknowledged these results. Electronically Signed   By: Donavan Foil M.D.   On: 04/12/2016 22:25   Mr Jodene Nam Head Wo Contrast  Result Date: 04/13/2016 CLINICAL DATA:  Slurred speech, dizziness LEFT facial droop while pumping gas at 6:15 p.m. EXAM: MRI HEAD WITHOUT CONTRAST MRA HEAD WITHOUT CONTRAST MRA NECK WITHOUT AND WITH CONTRAST TECHNIQUE: Multiplanar, multiecho pulse sequences of the brain and surrounding structures were obtained without intravenous contrast. Angiographic images of the Circle of Willis were obtained using MRA technique without intravenous contrast.  Angiographic images of the neck were obtained using MRA technique without and with intravenous contrast. Carotid stenosis measurements (when applicable) are obtained utilizing NASCET criteria, using the distal internal carotid diameter as the denominator. CONTRAST:  85mL MULTIHANCE GADOBENATE DIMEGLUMINE 529 MG/ML IV SOLN COMPARISON:  CT HEAD April 12, 2016 FINDINGS: MRI HEAD FINDINGS BRAIN: 11 x 19 mm reduced diffusion LEFT caudate body to putaminal/lenticulostriate nucleus. Corresponding low ADC values. Old RIGHT basal ganglia infarct with mineralization, no susceptibility artifact to suggest hemorrhage. Mild ex vacuo dilatation RIGHT lateral ventricle, ventricles and sulci are overall normal for patient's age. Patchy to confluent supratentorial white matter FLAIR T2 hyperintensities without midline shift, mass effect or masses. No abnormal extra-axial fluid collections. VASCULAR: Normal major intracranial vascular flow voids present at skull base. SKULL AND UPPER CERVICAL SPINE: No abnormal sellar expansion. No suspicious calvarial bone marrow signal. Craniocervical junction maintained. SINUSES/ORBITS: Bilateral posterior ethmoid mucosal thickening trace LEFT maxillary sinus mucosal thickening. Mastoid  air cells are well aerated. The included ocular globes and orbital contents are non-suspicious. OTHER: Patient is edentulous. MRA HEAD FINDINGS- Mild motion degraded examination. ANTERIOR CIRCULATION: Normal flow related enhancement of the included cervical, petrous, cavernous and supraclinoid internal carotid arteries. Patent anterior communicating artery. Normal flow related enhancement of the anterior and middle cerebral arteries, including distal segments. No large vessel occlusion, high-grade stenosis, abnormal luminal irregularity, aneurysm. POSTERIOR CIRCULATION: LEFT vertebral artery is dominant. Basilar artery is patent, with normal flow related enhancement of the main branch vessels. Normal flow related  enhancement of the posterior cerebral arteries. No large vessel occlusion, high-grade stenosis, abnormal luminal irregularity, aneurysm. MRA NECK FINDINGS Source images and MIP image were reviewed. No T1 shortening along the course of the artery's. The common carotid arteries are widely patent bilaterally. The carotid bifurcation is patent bilaterally and there is no significant carotid stenosis. Both vertebral arteries and patent to the basilar without significant vertebral stenosis. There is no evidence for atherosclerosis or flow limiting stenosis. IMPRESSION: MRI HEAD: Acute 11 x 19 mm LEFT lenticulostriate/basal ganglia nonhemorrhagic infarct. Old RIGHT basal ganglia infarct. Moderate chronic small vessel ischemic disease. MRA HEAD: Motion degraded examination without emergent large vessel occlusion or high-grade stenosis. MRA NECK: Normal. Electronically Signed   By: Elon Alas M.D.   On: 04/13/2016 02:38   Mr Angiogram Neck W Or Wo Contrast  Result Date: 04/13/2016 CLINICAL DATA:  Slurred speech, dizziness LEFT facial droop while pumping gas at 6:15 p.m. EXAM: MRI HEAD WITHOUT CONTRAST MRA HEAD WITHOUT CONTRAST MRA NECK WITHOUT AND WITH CONTRAST TECHNIQUE: Multiplanar, multiecho pulse sequences of the brain and surrounding structures were obtained without intravenous contrast. Angiographic images of the Circle of Willis were obtained using MRA technique without intravenous contrast. Angiographic images of the neck were obtained using MRA technique without and with intravenous contrast. Carotid stenosis measurements (when applicable) are obtained utilizing NASCET criteria, using the distal internal carotid diameter as the denominator. CONTRAST:  27mL MULTIHANCE GADOBENATE DIMEGLUMINE 529 MG/ML IV SOLN COMPARISON:  CT HEAD April 12, 2016 FINDINGS: MRI HEAD FINDINGS BRAIN: 11 x 19 mm reduced diffusion LEFT caudate body to putaminal/lenticulostriate nucleus. Corresponding low ADC values. Old RIGHT  basal ganglia infarct with mineralization, no susceptibility artifact to suggest hemorrhage. Mild ex vacuo dilatation RIGHT lateral ventricle, ventricles and sulci are overall normal for patient's age. Patchy to confluent supratentorial white matter FLAIR T2 hyperintensities without midline shift, mass effect or masses. No abnormal extra-axial fluid collections. VASCULAR: Normal major intracranial vascular flow voids present at skull base. SKULL AND UPPER CERVICAL SPINE: No abnormal sellar expansion. No suspicious calvarial bone marrow signal. Craniocervical junction maintained. SINUSES/ORBITS: Bilateral posterior ethmoid mucosal thickening trace LEFT maxillary sinus mucosal thickening. Mastoid air cells are well aerated. The included ocular globes and orbital contents are non-suspicious. OTHER: Patient is edentulous. MRA HEAD FINDINGS- Mild motion degraded examination. ANTERIOR CIRCULATION: Normal flow related enhancement of the included cervical, petrous, cavernous and supraclinoid internal carotid arteries. Patent anterior communicating artery. Normal flow related enhancement of the anterior and middle cerebral arteries, including distal segments. No large vessel occlusion, high-grade stenosis, abnormal luminal irregularity, aneurysm. POSTERIOR CIRCULATION: LEFT vertebral artery is dominant. Basilar artery is patent, with normal flow related enhancement of the main branch vessels. Normal flow related enhancement of the posterior cerebral arteries. No large vessel occlusion, high-grade stenosis, abnormal luminal irregularity, aneurysm. MRA NECK FINDINGS Source images and MIP image were reviewed. No T1 shortening along the course of the artery's. The common carotid arteries are  widely patent bilaterally. The carotid bifurcation is patent bilaterally and there is no significant carotid stenosis. Both vertebral arteries and patent to the basilar without significant vertebral stenosis. There is no evidence for  atherosclerosis or flow limiting stenosis. IMPRESSION: MRI HEAD: Acute 11 x 19 mm LEFT lenticulostriate/basal ganglia nonhemorrhagic infarct. Old RIGHT basal ganglia infarct. Moderate chronic small vessel ischemic disease. MRA HEAD: Motion degraded examination without emergent large vessel occlusion or high-grade stenosis. MRA NECK: Normal. Electronically Signed   By: Elon Alas M.D.   On: 04/13/2016 02:38   Mr Brain Wo Contrast  Result Date: 04/13/2016 CLINICAL DATA:  Slurred speech, dizziness LEFT facial droop while pumping gas at 6:15 p.m. EXAM: MRI HEAD WITHOUT CONTRAST MRA HEAD WITHOUT CONTRAST MRA NECK WITHOUT AND WITH CONTRAST TECHNIQUE: Multiplanar, multiecho pulse sequences of the brain and surrounding structures were obtained without intravenous contrast. Angiographic images of the Circle of Willis were obtained using MRA technique without intravenous contrast. Angiographic images of the neck were obtained using MRA technique without and with intravenous contrast. Carotid stenosis measurements (when applicable) are obtained utilizing NASCET criteria, using the distal internal carotid diameter as the denominator. CONTRAST:  60mL MULTIHANCE GADOBENATE DIMEGLUMINE 529 MG/ML IV SOLN COMPARISON:  CT HEAD April 12, 2016 FINDINGS: MRI HEAD FINDINGS BRAIN: 11 x 19 mm reduced diffusion LEFT caudate body to putaminal/lenticulostriate nucleus. Corresponding low ADC values. Old RIGHT basal ganglia infarct with mineralization, no susceptibility artifact to suggest hemorrhage. Mild ex vacuo dilatation RIGHT lateral ventricle, ventricles and sulci are overall normal for patient's age. Patchy to confluent supratentorial white matter FLAIR T2 hyperintensities without midline shift, mass effect or masses. No abnormal extra-axial fluid collections. VASCULAR: Normal major intracranial vascular flow voids present at skull base. SKULL AND UPPER CERVICAL SPINE: No abnormal sellar expansion. No suspicious calvarial  bone marrow signal. Craniocervical junction maintained. SINUSES/ORBITS: Bilateral posterior ethmoid mucosal thickening trace LEFT maxillary sinus mucosal thickening. Mastoid air cells are well aerated. The included ocular globes and orbital contents are non-suspicious. OTHER: Patient is edentulous. MRA HEAD FINDINGS- Mild motion degraded examination. ANTERIOR CIRCULATION: Normal flow related enhancement of the included cervical, petrous, cavernous and supraclinoid internal carotid arteries. Patent anterior communicating artery. Normal flow related enhancement of the anterior and middle cerebral arteries, including distal segments. No large vessel occlusion, high-grade stenosis, abnormal luminal irregularity, aneurysm. POSTERIOR CIRCULATION: LEFT vertebral artery is dominant. Basilar artery is patent, with normal flow related enhancement of the main branch vessels. Normal flow related enhancement of the posterior cerebral arteries. No large vessel occlusion, high-grade stenosis, abnormal luminal irregularity, aneurysm. MRA NECK FINDINGS Source images and MIP image were reviewed. No T1 shortening along the course of the artery's. The common carotid arteries are widely patent bilaterally. The carotid bifurcation is patent bilaterally and there is no significant carotid stenosis. Both vertebral arteries and patent to the basilar without significant vertebral stenosis. There is no evidence for atherosclerosis or flow limiting stenosis. IMPRESSION: MRI HEAD: Acute 11 x 19 mm LEFT lenticulostriate/basal ganglia nonhemorrhagic infarct. Old RIGHT basal ganglia infarct. Moderate chronic small vessel ischemic disease. MRA HEAD: Motion degraded examination without emergent large vessel occlusion or high-grade stenosis. MRA NECK: Normal. Electronically Signed   By: Elon Alas M.D.   On: 04/13/2016 02:38   US Carotid Bilateral (at [facility] Only)  Result Date: 04/13/2016 CLINICAL DATA:  TIA. EXAM: BILATERAL CAROTID  DUPLEX ULTRASOUND TECHNIQUE: Pearline Cables scale imaging, color Doppler and duplex ultrasound were performed of bilateral carotid and vertebral arteries in the neck. COMPARISON:  MRI 04/13/2016 . FINDINGS: Criteria: Quantification of carotid stenosis is based on velocity parameters that correlate the residual internal carotid diameter with NASCET-based stenosis levels, using the diameter of the distal internal carotid lumen as the denominator for stenosis measurement. The following velocity measurements were obtained: RIGHT ICA:  69/a cm/sec CCA:  [id number] cm/sec SYSTOLIC ICA/CCA RATIO:  0.8 DIASTOLIC ICA/CCA RATIO:  1.3 ECA:  109 Cm/sec LEFT ICA:  81/22 cm/sec CCA:  [id number] cm/sec SYSTOLIC ICA/CCA RATIO:  1.0 DIASTOLIC ICA/CCA RATIO:  1.4 ECA:  99 cm/sec RIGHT CAROTID ARTERY: Mild atherosclerotic vascular plaque. No flow limiting stenosis. Degree of stenosis less than 50% . RIGHT VERTEBRAL ARTERY:  Patent with antegrade flow. LEFT CAROTID ARTERY: Mild atherosclerotic vascular plaque. No flow limiting stenosis. Degree of stenosis less than 50%. LEFT VERTEBRAL ARTERY:  Patent with antegrade flow. IMPRESSION: 1. Mild bilateral atherosclerotic vascular plaque. No flow limiting stenosis. Degree of stenosis less than 50% bilaterally. 2. Vertebral arteries are patent antegrade flow. Electronically Signed   By: Marcello Moores  Register   On: 04/13/2016 12:56   Dg Chest Portable 1 View  Result Date: 04/12/2016 CLINICAL DATA:  Slurred speech with facial droop, chest pain EXAM: PORTABLE CHEST 1 VIEW COMPARISON:  None. FINDINGS: The heart size and mediastinal contours are within normal limits. Both lungs are clear. The visualized skeletal structures are unremarkable. IMPRESSION: No active disease. Electronically Signed   By: Donavan Foil M.D.   On: 04/12/2016 23:20    EKG:  No orders found for this or any previous visit.    Management plans discussed with the patient, son Cristie Hem over phone and they are in agreement.  CODE STATUS:      Code Status Orders        Start     Ordered   04/13/16 0221  Full code  Continuous     04/13/16 0220    Code Status History    Date Active Date Inactive Code Status Order ID Comments User Context   This patient has a current code status but no historical code status.      TOTAL TIME TAKING CARE OF THIS PATIENT: 45  minutes.   Note: This dictation was prepared with Dragon dictation along with smaller phrase technology. Any transcriptional errors that result from this process are unintentional.   @[facility] @  on 04/14/2016 at 4:19 PM  Between 7am to 6pm - Pager - [phone]  After 6pm go to [email or web address] - password [email or web address] [facility]  [facility]  Office  [phone]  CC: Primary care physician; No PCP Per Patient

## 2016-04-14 NOTE — Progress Notes (Signed)
Hypertension and Diabetes education reviewed with patient. New medications, Lisinopril and Metformin added as daily medications to help control these new problems for patient. Patient verbalized understanding of education time.

## 2016-04-14 NOTE — Progress Notes (Signed)
  RD consulted for nutrition education regarding diabetes.   Lab Results  Component Value Date   HGBA1C 7.7 (H) 04/13/2016    RD provided "Plate Method" handout and RN to provide Living Well with DM booklet. Discussed different food groups and their effects on blood sugar, emphasizing carbohydrate-containing foods. Discussed avoiding sugary drinks and provide alternative beverages that will not effect blood glucose  Also discussed low sodium foods and avoiding salt to improve blood pressure.  Pt verbalized understanding  Expect fair compliance.  Body mass index is 21.74 kg/m.  Current diet order is regular, patient is consuming approximately 100% of meals at this time. Labs and medications reviewed. No further nutrition interventions warranted at this time. RD contact information provided. If additional nutrition issues arise, please re-consult RD.  Jennessa Trigo B. Zenia Resides, [place], [place] (pager) Weekend/On-Call pager [phone])

## 2016-04-14 NOTE — Progress Notes (Signed)
Subjective: Patient without new neurological complaints.  Feels speech is back to baseline.    Objective: Current vital signs: BP (!) 160/74 (BP Location: Left Arm)   Pulse (!) 59   Temp 98 F (36.7 C) (Oral)   Resp 18   Ht 5\' 10"  (1.778 m)   Wt 68.7 kg (151 lb 8 oz)   SpO2 99%   BMI 21.74 kg/m  Vital signs in last 24 hours: Temp:  [98 F (36.7 C)-98.4 F (36.9 C)] 98 F (36.7 C) (10/27 0700) Pulse Rate:  [51-59] 59 (10/27 0700) Resp:  [16-20] 18 (10/27 0700) BP: (160-181)/(74-80) 160/74 (10/27 0700) SpO2:  [95 %-99 %] 99 % (10/27 0700) Weight:  [68.7 kg (151 lb 8 oz)] 68.7 kg (151 lb 8 oz) (10/26 1312)  Intake/Output from previous day: 10/26 0701 - 10/27 0700 In: 1433.8 [P.O.:170; I.V.:1263.8] Out: 900 [Urine:900] Intake/Output this shift: Total I/O In: 120 [P.O.:120] Out: 650 [Urine:650] Nutritional status: Diet regular Room service appropriate? Yes; Fluid consistency: Thin  Neurologic Exam: Mental Status: Alert, oriented, thought content appropriate.  Speech fluent without evidence of aphasia, no dysarthria.  Able to follow 3 step commands without difficulty. Cranial Nerves: II: Discs flat bilaterally; Visual fields grossly normal, pupils equal, round, reactive to light and accommodation III,IV, VI: ptosis not present, extra-ocular motions intact bilaterally V,VII: left facial droop, facial light touch sensation normal bilaterally VIII: hearing normal bilaterally IX,X: gag reflex present XI: bilateral shoulder shrug XII: midline tongue extension Motor: Right :  Upper extremity   5/5                                      Left:     Upper extremity   5/5             Lower extremity   5/5                                                  Lower extremity   5/5 Tone and bulk:normal tone throughout; no atrophy noted  Lab Results: Basic Metabolic Panel:  Recent Labs Lab 04/12/16 2216 04/13/16 0340 04/14/16 0253  NA 141 139 138  K 3.6 3.4* 3.6  CL 103 105 105   CO2 29 29 27   GLUCOSE 198* 172* 163*  BUN 17 17 14   CREATININE 1.14 1.02 1.03  CALCIUM 9.1 8.4* 8.7*    Liver Function Tests:  Recent Labs Lab 04/12/16 2216 04/13/16 0340  AST 20 15  ALT 17 13*  ALKPHOS 64 49  BILITOT 0.6 0.7  PROT 6.8 6.0*  ALBUMIN 3.8 3.3*   No results for input(s): LIPASE, AMYLASE in the last 168 hours. No results for input(s): AMMONIA in the last 168 hours.  CBC:  Recent Labs Lab 04/12/16 2216 04/13/16 0340 04/14/16 0253  WBC 6.9 7.3 7.6  HGB 15.6 13.8 14.4  HCT 45.2 39.7* 42.3  MCV 95.2 95.9 96.0  PLT 244 214 212    Cardiac Enzymes:  Recent Labs Lab 04/13/16 0831 04/13/16 1413 04/13/16 2057 04/14/16 0253 04/14/16 0836  TROPONINI <0.03 <0.03 <0.03 <0.03 <0.03    Lipid Panel:  Recent Labs Lab 04/13/16 0340  CHOL 163  TRIG 108  HDL 46  CHOLHDL 3.5  VLDL 22  LDLCALC 95  CBG:  Recent Labs Lab 04/13/16 0226 04/13/16 1657 04/13/16 2113 04/14/16 0723 04/14/16 1131  GLUCAP 181* 207* 165* 155* 205*    Microbiology: Results for orders placed or performed during the hospital encounter of 04/12/16  MRSA PCR Screening     Status: None   Collection Time: 04/13/16  2:20 AM  Result Value Ref Range Status   MRSA by PCR NEGATIVE NEGATIVE Final    Comment:        The GeneXpert MRSA Assay (FDA approved for NASAL specimens only), is one component of a comprehensive MRSA colonization surveillance program. It is not intended to diagnose MRSA infection nor to guide or monitor treatment for MRSA infections.     Coagulation Studies:  Recent Labs  04/12/16 2216  LABPROT 13.1  INR 0.99    Imaging: Ct Head Wo Contrast  Result Date: 04/12/2016 CLINICAL DATA:  Slurred speech and dizzy EXAM: CT HEAD WITHOUT CONTRAST TECHNIQUE: Contiguous axial images were obtained from the base of the skull through the vertex without intravenous contrast. COMPARISON:  None. FINDINGS: Brain: No evidence of acute infarction, hemorrhage,  hydrocephalus, extra-axial collection or mass lesion/mass effect. Moderate periventricular white matter hypo densities most likely small vessel ischemic change. Old appearing lacune in the left sub insula. Small vessel disease extends into the right ganglial capsular region. Mild atrophy. Vascular: No hyperdense vessels. Mild calcifications in the carotid arteries at the skullbase. Skull: Normal. Negative for fracture or focal lesion. Sinuses/Orbits: Mild mucosal thickening in the ethmoid sinuses. Orbits appear grossly intact. Other: None IMPRESSION: No CT evidence for acute intracranial abnormality. Moderate periventricular and right ganglial capsular small vessel ischemic changes. Old appearing left sub insular lacune. If continued clinical concern for CVA, MRI follow-up would be recommended. Critical Value/emergent results were called by telephone at the time of interpretation on 04/12/2016 at 10:25 pm to Dr. Mariea Clonts who was covering for Dr. Hinda Kehr , who verbally acknowledged these results. Electronically Signed   By: Donavan Foil M.D.   On: 04/12/2016 22:25   Mr Jodene Nam Head Wo Contrast  Result Date: 04/13/2016 CLINICAL DATA:  Slurred speech, dizziness LEFT facial droop while pumping gas at 6:15 p.m. EXAM: MRI HEAD WITHOUT CONTRAST MRA HEAD WITHOUT CONTRAST MRA NECK WITHOUT AND WITH CONTRAST TECHNIQUE: Multiplanar, multiecho pulse sequences of the brain and surrounding structures were obtained without intravenous contrast. Angiographic images of the Circle of Willis were obtained using MRA technique without intravenous contrast. Angiographic images of the neck were obtained using MRA technique without and with intravenous contrast. Carotid stenosis measurements (when applicable) are obtained utilizing NASCET criteria, using the distal internal carotid diameter as the denominator. CONTRAST:  75mL MULTIHANCE GADOBENATE DIMEGLUMINE 529 MG/ML IV SOLN COMPARISON:  CT HEAD April 12, 2016 FINDINGS: MRI HEAD  FINDINGS BRAIN: 11 x 19 mm reduced diffusion LEFT caudate body to putaminal/lenticulostriate nucleus. Corresponding low ADC values. Old RIGHT basal ganglia infarct with mineralization, no susceptibility artifact to suggest hemorrhage. Mild ex vacuo dilatation RIGHT lateral ventricle, ventricles and sulci are overall normal for patient's age. Patchy to confluent supratentorial white matter FLAIR T2 hyperintensities without midline shift, mass effect or masses. No abnormal extra-axial fluid collections. VASCULAR: Normal major intracranial vascular flow voids present at skull base. SKULL AND UPPER CERVICAL SPINE: No abnormal sellar expansion. No suspicious calvarial bone marrow signal. Craniocervical junction maintained. SINUSES/ORBITS: Bilateral posterior ethmoid mucosal thickening trace LEFT maxillary sinus mucosal thickening. Mastoid air cells are well aerated. The included ocular globes and orbital contents are non-suspicious. OTHER: Patient is  edentulous. MRA HEAD FINDINGS- Mild motion degraded examination. ANTERIOR CIRCULATION: Normal flow related enhancement of the included cervical, petrous, cavernous and supraclinoid internal carotid arteries. Patent anterior communicating artery. Normal flow related enhancement of the anterior and middle cerebral arteries, including distal segments. No large vessel occlusion, high-grade stenosis, abnormal luminal irregularity, aneurysm. POSTERIOR CIRCULATION: LEFT vertebral artery is dominant. Basilar artery is patent, with normal flow related enhancement of the main branch vessels. Normal flow related enhancement of the posterior cerebral arteries. No large vessel occlusion, high-grade stenosis, abnormal luminal irregularity, aneurysm. MRA NECK FINDINGS Source images and MIP image were reviewed. No T1 shortening along the course of the artery's. The common carotid arteries are widely patent bilaterally. The carotid bifurcation is patent bilaterally and there is no  significant carotid stenosis. Both vertebral arteries and patent to the basilar without significant vertebral stenosis. There is no evidence for atherosclerosis or flow limiting stenosis. IMPRESSION: MRI HEAD: Acute 11 x 19 mm LEFT lenticulostriate/basal ganglia nonhemorrhagic infarct. Old RIGHT basal ganglia infarct. Moderate chronic small vessel ischemic disease. MRA HEAD: Motion degraded examination without emergent large vessel occlusion or high-grade stenosis. MRA NECK: Normal. Electronically Signed   By: Elon Alas M.D.   On: 04/13/2016 02:38   Mr Angiogram Neck W Or Wo Contrast  Result Date: 04/13/2016 CLINICAL DATA:  Slurred speech, dizziness LEFT facial droop while pumping gas at 6:15 p.m. EXAM: MRI HEAD WITHOUT CONTRAST MRA HEAD WITHOUT CONTRAST MRA NECK WITHOUT AND WITH CONTRAST TECHNIQUE: Multiplanar, multiecho pulse sequences of the brain and surrounding structures were obtained without intravenous contrast. Angiographic images of the Circle of Willis were obtained using MRA technique without intravenous contrast. Angiographic images of the neck were obtained using MRA technique without and with intravenous contrast. Carotid stenosis measurements (when applicable) are obtained utilizing NASCET criteria, using the distal internal carotid diameter as the denominator. CONTRAST:  76mL MULTIHANCE GADOBENATE DIMEGLUMINE 529 MG/ML IV SOLN COMPARISON:  CT HEAD April 12, 2016 FINDINGS: MRI HEAD FINDINGS BRAIN: 11 x 19 mm reduced diffusion LEFT caudate body to putaminal/lenticulostriate nucleus. Corresponding low ADC values. Old RIGHT basal ganglia infarct with mineralization, no susceptibility artifact to suggest hemorrhage. Mild ex vacuo dilatation RIGHT lateral ventricle, ventricles and sulci are overall normal for patient's age. Patchy to confluent supratentorial white matter FLAIR T2 hyperintensities without midline shift, mass effect or masses. No abnormal extra-axial fluid collections.  VASCULAR: Normal major intracranial vascular flow voids present at skull base. SKULL AND UPPER CERVICAL SPINE: No abnormal sellar expansion. No suspicious calvarial bone marrow signal. Craniocervical junction maintained. SINUSES/ORBITS: Bilateral posterior ethmoid mucosal thickening trace LEFT maxillary sinus mucosal thickening. Mastoid air cells are well aerated. The included ocular globes and orbital contents are non-suspicious. OTHER: Patient is edentulous. MRA HEAD FINDINGS- Mild motion degraded examination. ANTERIOR CIRCULATION: Normal flow related enhancement of the included cervical, petrous, cavernous and supraclinoid internal carotid arteries. Patent anterior communicating artery. Normal flow related enhancement of the anterior and middle cerebral arteries, including distal segments. No large vessel occlusion, high-grade stenosis, abnormal luminal irregularity, aneurysm. POSTERIOR CIRCULATION: LEFT vertebral artery is dominant. Basilar artery is patent, with normal flow related enhancement of the main branch vessels. Normal flow related enhancement of the posterior cerebral arteries. No large vessel occlusion, high-grade stenosis, abnormal luminal irregularity, aneurysm. MRA NECK FINDINGS Source images and MIP image were reviewed. No T1 shortening along the course of the artery's. The common carotid arteries are widely patent bilaterally. The carotid bifurcation is patent bilaterally and there is no significant carotid stenosis. Both  vertebral arteries and patent to the basilar without significant vertebral stenosis. There is no evidence for atherosclerosis or flow limiting stenosis. IMPRESSION: MRI HEAD: Acute 11 x 19 mm LEFT lenticulostriate/basal ganglia nonhemorrhagic infarct. Old RIGHT basal ganglia infarct. Moderate chronic small vessel ischemic disease. MRA HEAD: Motion degraded examination without emergent large vessel occlusion or high-grade stenosis. MRA NECK: Normal. Electronically Signed   By:  Elon Alas M.D.   On: 04/13/2016 02:38   Mr Brain Wo Contrast  Result Date: 04/13/2016 CLINICAL DATA:  Slurred speech, dizziness LEFT facial droop while pumping gas at 6:15 p.m. EXAM: MRI HEAD WITHOUT CONTRAST MRA HEAD WITHOUT CONTRAST MRA NECK WITHOUT AND WITH CONTRAST TECHNIQUE: Multiplanar, multiecho pulse sequences of the brain and surrounding structures were obtained without intravenous contrast. Angiographic images of the Circle of Willis were obtained using MRA technique without intravenous contrast. Angiographic images of the neck were obtained using MRA technique without and with intravenous contrast. Carotid stenosis measurements (when applicable) are obtained utilizing NASCET criteria, using the distal internal carotid diameter as the denominator. CONTRAST:  90mL MULTIHANCE GADOBENATE DIMEGLUMINE 529 MG/ML IV SOLN COMPARISON:  CT HEAD April 12, 2016 FINDINGS: MRI HEAD FINDINGS BRAIN: 11 x 19 mm reduced diffusion LEFT caudate body to putaminal/lenticulostriate nucleus. Corresponding low ADC values. Old RIGHT basal ganglia infarct with mineralization, no susceptibility artifact to suggest hemorrhage. Mild ex vacuo dilatation RIGHT lateral ventricle, ventricles and sulci are overall normal for patient's age. Patchy to confluent supratentorial white matter FLAIR T2 hyperintensities without midline shift, mass effect or masses. No abnormal extra-axial fluid collections. VASCULAR: Normal major intracranial vascular flow voids present at skull base. SKULL AND UPPER CERVICAL SPINE: No abnormal sellar expansion. No suspicious calvarial bone marrow signal. Craniocervical junction maintained. SINUSES/ORBITS: Bilateral posterior ethmoid mucosal thickening trace LEFT maxillary sinus mucosal thickening. Mastoid air cells are well aerated. The included ocular globes and orbital contents are non-suspicious. OTHER: Patient is edentulous. MRA HEAD FINDINGS- Mild motion degraded examination. ANTERIOR  CIRCULATION: Normal flow related enhancement of the included cervical, petrous, cavernous and supraclinoid internal carotid arteries. Patent anterior communicating artery. Normal flow related enhancement of the anterior and middle cerebral arteries, including distal segments. No large vessel occlusion, high-grade stenosis, abnormal luminal irregularity, aneurysm. POSTERIOR CIRCULATION: LEFT vertebral artery is dominant. Basilar artery is patent, with normal flow related enhancement of the main branch vessels. Normal flow related enhancement of the posterior cerebral arteries. No large vessel occlusion, high-grade stenosis, abnormal luminal irregularity, aneurysm. MRA NECK FINDINGS Source images and MIP image were reviewed. No T1 shortening along the course of the artery's. The common carotid arteries are widely patent bilaterally. The carotid bifurcation is patent bilaterally and there is no significant carotid stenosis. Both vertebral arteries and patent to the basilar without significant vertebral stenosis. There is no evidence for atherosclerosis or flow limiting stenosis. IMPRESSION: MRI HEAD: Acute 11 x 19 mm LEFT lenticulostriate/basal ganglia nonhemorrhagic infarct. Old RIGHT basal ganglia infarct. Moderate chronic small vessel ischemic disease. MRA HEAD: Motion degraded examination without emergent large vessel occlusion or high-grade stenosis. MRA NECK: Normal. Electronically Signed   By: Elon Alas M.D.   On: 04/13/2016 02:38   US Carotid Bilateral (at [facility] Only)  Result Date: 04/13/2016 CLINICAL DATA:  TIA. EXAM: BILATERAL CAROTID DUPLEX ULTRASOUND TECHNIQUE: Pearline Cables scale imaging, color Doppler and duplex ultrasound were performed of bilateral carotid and vertebral arteries in the neck. COMPARISON:  MRI 04/13/2016 . FINDINGS: Criteria: Quantification of carotid stenosis is based on velocity parameters that correlate the  residual internal carotid diameter with NASCET-based stenosis levels,  using the diameter of the distal internal carotid lumen as the denominator for stenosis measurement. The following velocity measurements were obtained: RIGHT ICA:  69/a cm/sec CCA:  [id number] cm/sec SYSTOLIC ICA/CCA RATIO:  0.8 DIASTOLIC ICA/CCA RATIO:  1.3 ECA:  109 Cm/sec LEFT ICA:  81/22 cm/sec CCA:  [id number] cm/sec SYSTOLIC ICA/CCA RATIO:  1.0 DIASTOLIC ICA/CCA RATIO:  1.4 ECA:  99 cm/sec RIGHT CAROTID ARTERY: Mild atherosclerotic vascular plaque. No flow limiting stenosis. Degree of stenosis less than 50% . RIGHT VERTEBRAL ARTERY:  Patent with antegrade flow. LEFT CAROTID ARTERY: Mild atherosclerotic vascular plaque. No flow limiting stenosis. Degree of stenosis less than 50%. LEFT VERTEBRAL ARTERY:  Patent with antegrade flow. IMPRESSION: 1. Mild bilateral atherosclerotic vascular plaque. No flow limiting stenosis. Degree of stenosis less than 50% bilaterally. 2. Vertebral arteries are patent antegrade flow. Electronically Signed   By: Marcello Moores  Register   On: 04/13/2016 12:56   Dg Chest Portable 1 View  Result Date: 04/12/2016 CLINICAL DATA:  Slurred speech with facial droop, chest pain EXAM: PORTABLE CHEST 1 VIEW COMPARISON:  None. FINDINGS: The heart size and mediastinal contours are within normal limits. Both lungs are clear. The visualized skeletal structures are unremarkable. IMPRESSION: No active disease. Electronically Signed   By: Donavan Foil M.D.   On: 04/12/2016 23:20    Medications:  I have reviewed the patient's current medications. Scheduled: .  stroke: mapping our early stages of recovery book   Does not apply Once  . aspirin  162 mg Oral Once  . [START ON 04/15/2016] aspirin EC  325 mg Oral Daily  . atorvastatin  40 mg Oral q1800  . enoxaparin (LOVENOX) injection  40 mg Subcutaneous QHS  . insulin aspart  0-9 Units Subcutaneous TID WC    Assessment/Plan: Patient reports being back to baseline. On ASA.  A1c 7.7.  Carotid dopplers show no evidence of hemodynamically significant  stenosis. Echo result remains pending.  Recommendations:  1.  Blood sugar control 2.  Improved blood pressure control 3.  Agree with continued lipid management and ASA.   4.  Patient to follow up with neurology as an outpatient    LOS: 1 day   Alexis Goodell, MD Neurology [phone] 04/14/2016  11:58 AM

## 2016-04-14 NOTE — Progress Notes (Signed)
Note patient to be d/c'd home today. A1C indicates new diagnosis of diabetes.  Discussed with RN and she has reviewed Living well with diabetes booklet with patient.    Adah Perl, RN, BC-ADM Inpatient Diabetes Coordinator Pager [phone]

## 2016-04-14 NOTE — Progress Notes (Signed)
While rounding, [place] made initial visit to room 118. Pt was alert and in good spirits. Pt was hoping for discharge today. Pt gave no indication of need for spiritual care at this time. [facility] is available for follow up as needed.    04/14/16 1300  Clinical Encounter Type  Visited With Patient  Visit Type Initial;Spiritual support  Referral From Nurse

## 2016-04-14 NOTE — Progress Notes (Signed)
Spoke with Sonia Side in ECHO. Currently ECHO has not been read. Sonia Side to notify [facility] this is the only test result pending for patient discharge planned for today.

## 2016-04-14 NOTE — Progress Notes (Signed)
Discharge paperwork and new prescriptions reviewed with patient who verbalized understanding. All questions answered. Patient is stable for discharge. Patient's son to transport home.

## 2016-04-14 NOTE — Care Management (Signed)
Admitted to [facility] with the diagnosis of TIA. Wife is Remo Lipps [phone]). Wife is in [place], so living alone now. No primary care physician. Gave list of physicians accepting new patients. Encourage to call physician on list, said he was looking at a [facility]?.  If he needs prescriptiond filled, will go to [facility] in [place]. Takes care of all basic and instrumental activities of daily living himself, drives. No falls. Good appetite. Nephew, Rande Brunt will transport. Shelbie Ammons RN MSN CCM Care Management [phone]

## 2016-04-14 NOTE — Discharge Instructions (Signed)
Activity as tolerated, outpatient PT Diabetic and cardiac diet Follow-up with [facility] Neurology follow-up with Dr. Manuella Ghazi in 2 weeks

## 2017-05-19 DIAGNOSIS — Z23 Encounter for immunization: Secondary | ICD-10-CM | POA: Diagnosis not present

## 2017-10-19 IMAGING — CT CT HEAD W/O CM
3 series · 15 of 47 positions shown, 18 images · non-contrast
Comparison: None.

CLINICAL DATA: Slurred speech and dizzy

EXAM:
CT HEAD WITHOUT CONTRAST
TECHNIQUE: Contiguous axial images were obtained from the base of the skull
through the vertex without intravenous contrast.

[Series 2: head wo · axial · 0.43mm/px · z∈[+503,+628]mm · 9 of 30 slices shown, 12 images]
[im 3/30  brain]
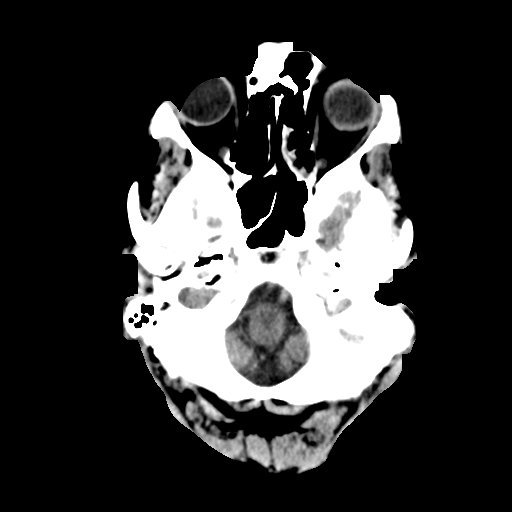
[im 3/30  bone]
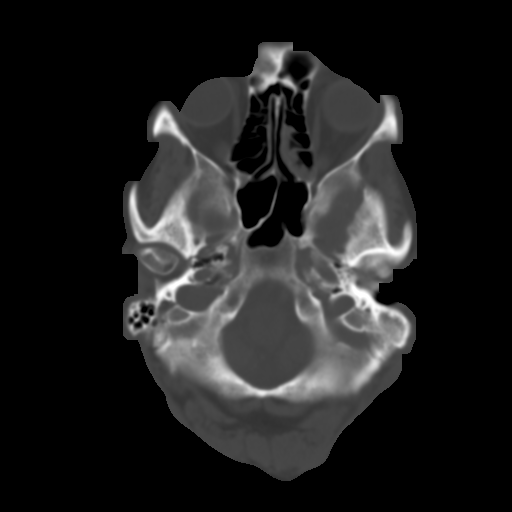
[im 6/30  brain]
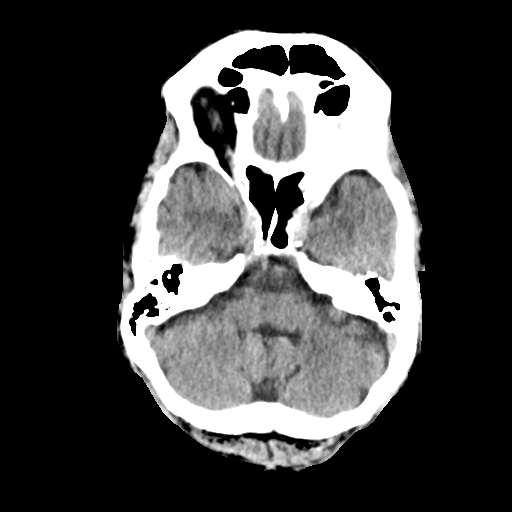
[im 9/30  brain]
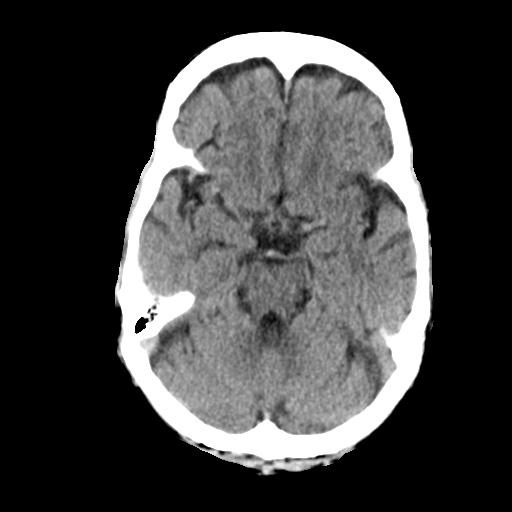
[im 12/30  brain]
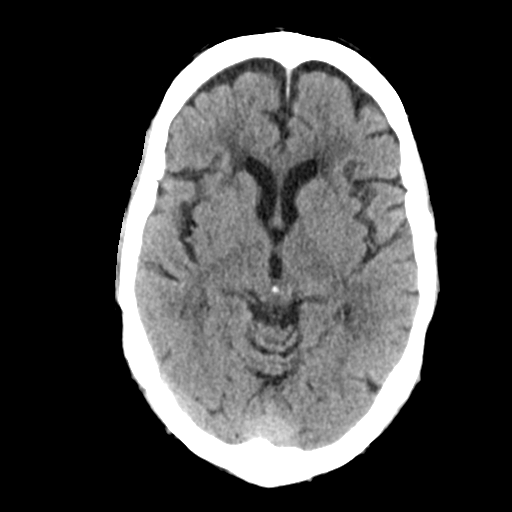
[im 16/30  brain]
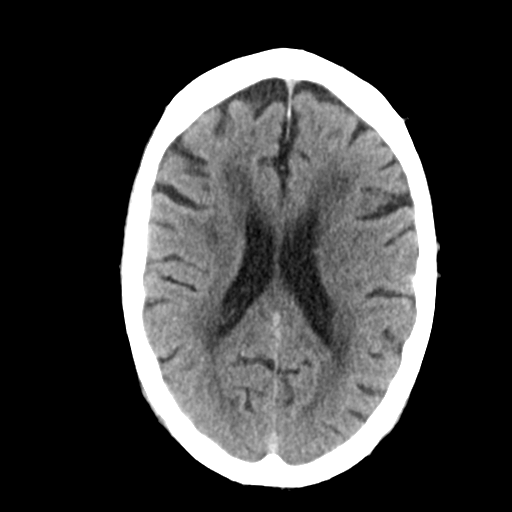
[im 16/30  bone]
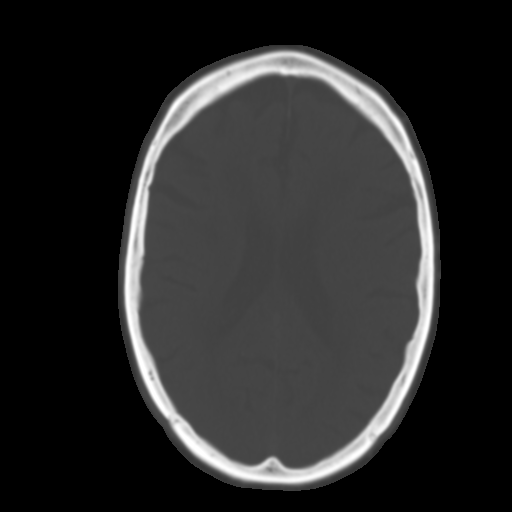
[im 19/30  brain]
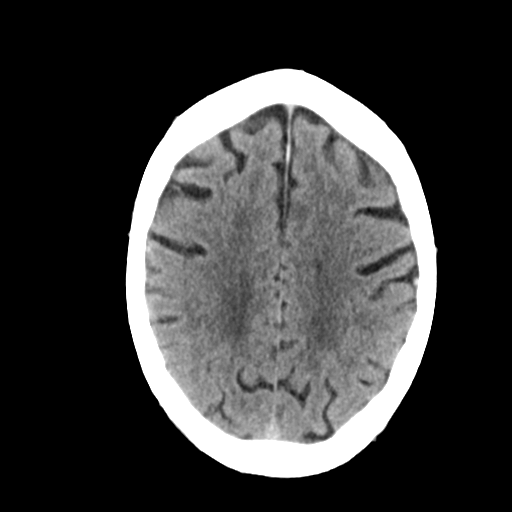
[im 22/30  brain]
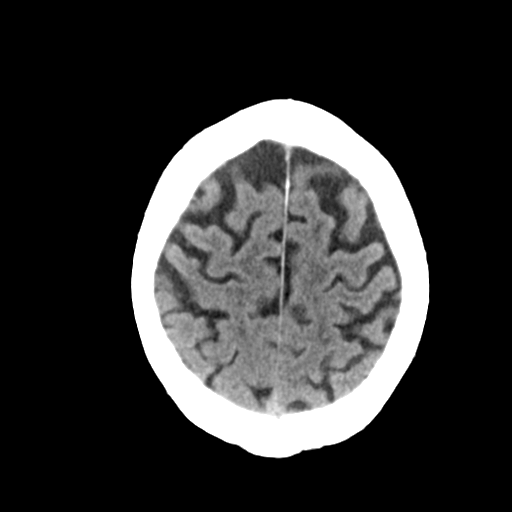
[im 25/30  brain]
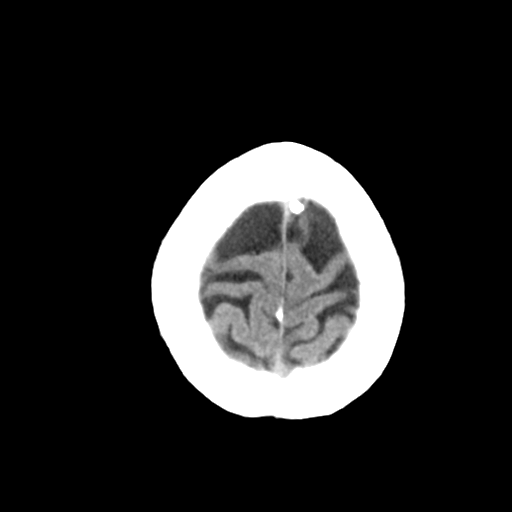
[im 28/30  brain]
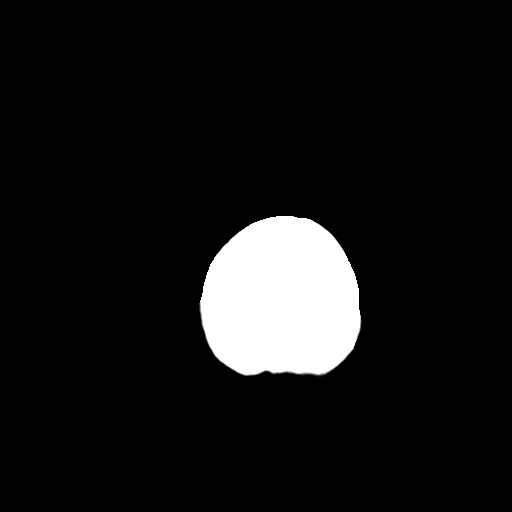
[im 28/30  bone]
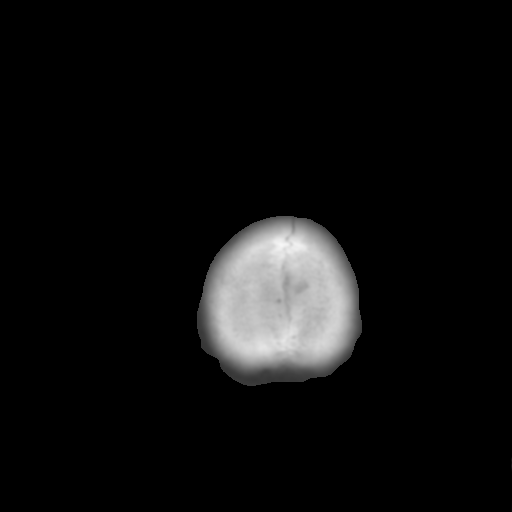

[Series 4: coronal soft tissue · coronal · 0.31mm/px · 3 of 67 slices shown]
[im 23/67  brain]
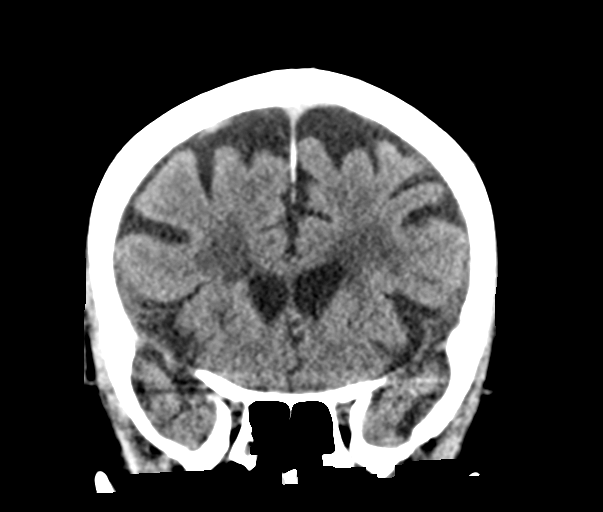
[im 30/67  brain]
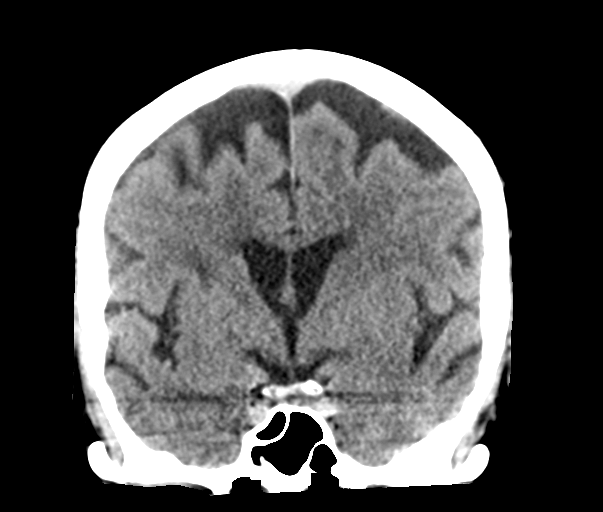
[im 37/67  brain]
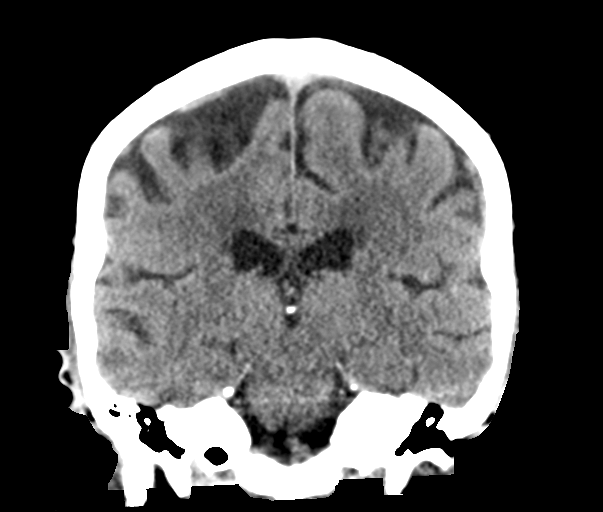

[Series 5: sagittal soft tissue · sagittal · 0.32mm/px · 3 of 48 slices shown]
[im 16/48  brain]
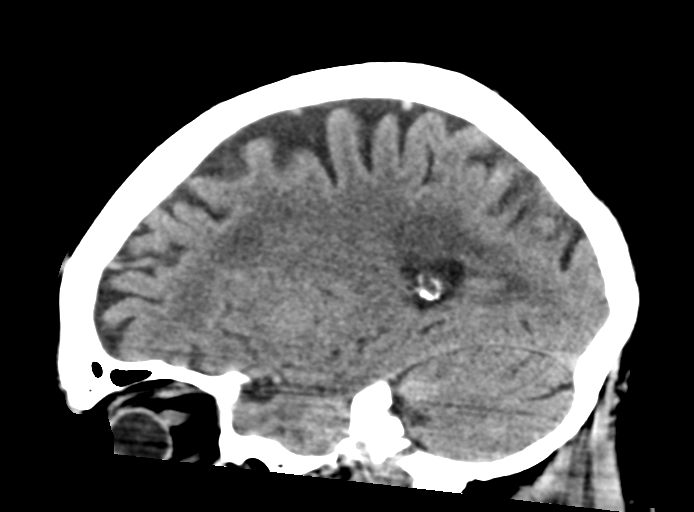
[im 24/48  brain]
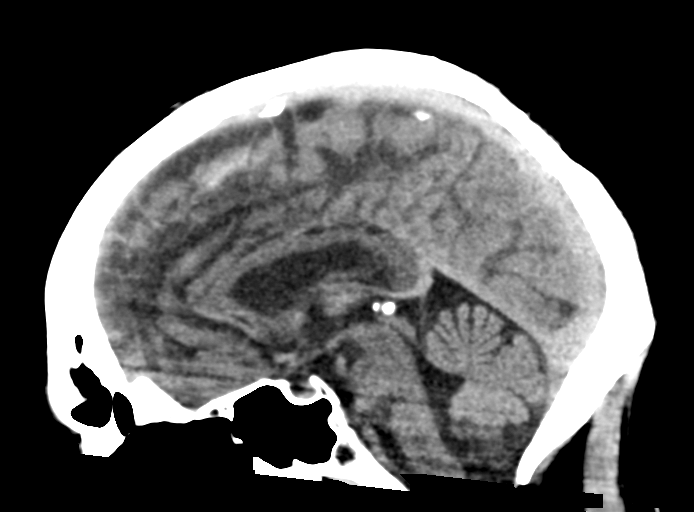
[im 32/48  brain]
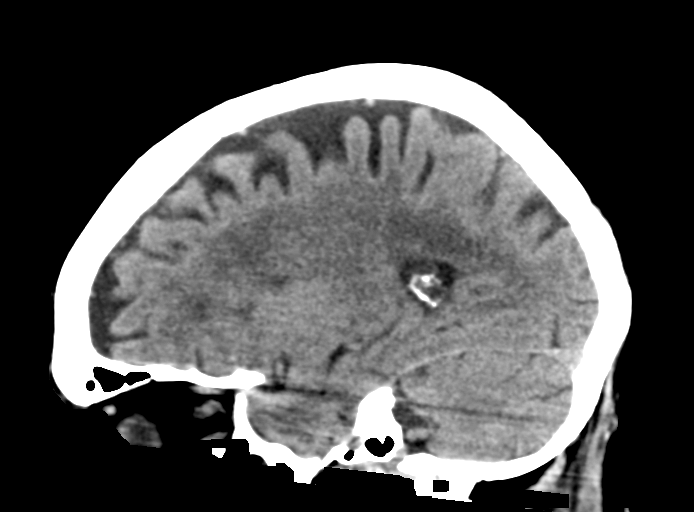

[15 of 47 positions shown; findings below may reference images not displayed]

FINDINGS: Brain: No evidence of acute infarction, hemorrhage, hydrocephalus,
extra-axial collection or mass lesion/mass effect. Moderate
periventricular white matter hypo densities most likely small vessel
ischemic change. Old appearing lacune in the left sub insula. Small
vessel disease extends into the right ganglial capsular region. Mild
atrophy.

Vascular: No hyperdense vessels. Mild calcifications in the carotid
arteries at the skullbase.

Skull: Normal. Negative for fracture or focal lesion.

Sinuses/Orbits: Mild mucosal thickening in the ethmoid sinuses.
Orbits appear grossly intact.

Other: None
IMPRESSION: No CT evidence for acute intracranial abnormality. Moderate
periventricular and right ganglial capsular small vessel ischemic
changes. Old appearing left sub insular lacune. If continued
clinical concern for CVA, MRI follow-up would be recommended.

Critical Value/emergent results were called by telephone at the time
of interpretation on 04/12/2016 at [DATE] to Dr. Salindeho who was
covering for Dr. IMAMI PER ASABELLA , who verbally acknowledged these
results.

## 2018-02-07 ENCOUNTER — Emergency Department
Admission: EM | Admit: 2018-02-07 | Discharge: 2018-02-07 | Disposition: A | Discharge status: Home / Self Care | Payer: Medicare Other | Attending: Emergency Medicine | Admitting: Emergency Medicine

## 2018-02-07 ENCOUNTER — Other Ambulatory Visit: Payer: Self-pay

## 2018-02-07 ENCOUNTER — Emergency Department: Payer: Medicare Other

## 2018-02-07 DIAGNOSIS — Z8673 Personal history of transient ischemic attack (TIA), and cerebral infarction without residual deficits: Secondary | ICD-10-CM | POA: Diagnosis not present

## 2018-02-07 DIAGNOSIS — Z9114 Patient's other noncompliance with medication regimen: Secondary | ICD-10-CM | POA: Insufficient documentation

## 2018-02-07 DIAGNOSIS — E1169 Type 2 diabetes mellitus with other specified complication: Secondary | ICD-10-CM | POA: Insufficient documentation

## 2018-02-07 DIAGNOSIS — E118 Type 2 diabetes mellitus with unspecified complications: Secondary | ICD-10-CM

## 2018-02-07 DIAGNOSIS — Z87891 Personal history of nicotine dependence: Secondary | ICD-10-CM | POA: Insufficient documentation

## 2018-02-07 DIAGNOSIS — M25511 Pain in right shoulder: Secondary | ICD-10-CM | POA: Diagnosis not present

## 2018-02-07 LAB — BASIC METABOLIC PANEL
ANION GAP: 10 (ref 5–15)
BUN: 21 mg/dL (ref 8–23)
CALCIUM: 9.3 mg/dL (ref 8.9–10.3)
CO2: 28 mmol/L (ref 22–32)
CREATININE: 1.5 mg/dL — AB (ref 0.61–1.24)
Chloride: 100 mmol/L (ref 98–111)
GFR calc Af Amer: 50 mL/min — ABNORMAL LOW (ref >60)
GFR, EST NON AFRICAN AMERICAN: 43 mL/min — AB (ref >60)
GLUCOSE: 227 mg/dL — AB (ref 70–99)
Potassium: 4.3 mmol/L (ref 3.5–5.1)
Sodium: 138 mmol/L (ref 135–145)

## 2018-02-07 LAB — CBC
HCT: 45.7 % (ref 40.0–52.0)
Hemoglobin: 15.9 g/dL (ref 13.0–18.0)
MCH: 33.1 pg (ref 26.0–34.0)
MCHC: 34.9 g/dL (ref 32.0–36.0)
MCV: 94.9 fL (ref 80.0–100.0)
PLATELETS: 272 10*3/uL (ref 150–440)
RBC: 4.82 MIL/uL (ref 4.40–5.90)
RDW: 13.7 % (ref 11.5–14.5)
WBC: 5.8 10*3/uL (ref 3.8–10.6)

## 2018-02-07 MED ORDER — ACETAMINOPHEN 500 MG PO TABS: 500.0000 mg | ORAL_TABLET | Freq: Four times a day (QID) | ORAL | 0 refills | Status: DC | PRN

## 2018-02-07 MED ORDER — METFORMIN HCL 500 MG PO TABS: 500.0000 mg | ORAL_TABLET | Freq: Two times a day (BID) | ORAL | 0 refills | Status: DC

## 2018-02-07 NOTE — ED Notes (Signed)
See triage note  States he slipped 2 months ago and pulled his right shoulder  Has had cont pain since  Pain increases with movement  No deformity noted

## 2018-02-07 NOTE — ED Triage Notes (Signed)
Pt presents today via POV for shoulder pain. Pt states he feel on a wet surface 2 mths ago. Has not been seen by any provider. Pt states it just keeps hurting with no relief, so pt came in today.

## 2018-02-07 NOTE — Discharge Instructions (Addendum)
Please establish care with a family doctor for diabetes control and medication management. Your diabetes is affecting your kidneys so you need follow up as soon as possible.

## 2018-02-07 NOTE — ED Provider Notes (Signed)
[facility] Emergency Department Provider Note  ____________________________________________  Time seen: Approximately 4:17 PM  I have reviewed the triage vital signs and the nursing notes.   HISTORY  Chief Complaint Shoulder Pain    HPI Eric Morris is a 77 y.o. male presents emergency department for evaluation of right shoulder pain for 2 months.  Patient states that he was reaching his arm up to grab something in his arm bent backwards.  He has had pain in his shoulder since.  No alleviating measures have been attempted.  He does not have a primary care doctor.  He has not seen a provider in about 2 years when he was last at the emergency department.  He does not take any medications daily.  No recent injury.  No shortness of breath, chest pain, nausea, vomiting, abdominal pain.   History reviewed. No pertinent past medical history.  Patient Active Problem List   Diagnosis Date Noted  . CVA (cerebral vascular accident) ([place]) 04/13/2016  . TIA (transient ischemic attack) 04/12/2016    Past Surgical History:  Procedure Laterality Date  . BACK SURGERY    . HERNIA REPAIR      Prior to Admission medications   Medication Sig Start Date End Date Taking? Authorizing Provider  acetaminophen (TYLENOL) 500 MG tablet Take 1 tablet (500 mg total) by mouth every 6 (six) hours as needed. 02/07/18   Laban Emperor, PA-C  aspirin EC 325 MG EC tablet Take 1 tablet (325 mg total) by mouth daily. 04/15/16   Nicholes Mango, MD  atorvastatin (LIPITOR) 40 MG tablet Take 1 tablet (40 mg total) by mouth daily at 6 PM. 04/14/16   Gouru, Aruna, MD  lisinopril (PRINIVIL,ZESTRIL) 5 MG tablet Take 2 tablets (10 mg total) by mouth daily. 04/15/16   Nicholes Mango, MD  metFORMIN (GLUCOPHAGE) 500 MG tablet Take 1 tablet (500 mg total) by mouth 2 (two) times daily with a meal. 02/07/18   Laban Emperor, PA-C    Allergies Patient has no known allergies.  No family history on  file.  Social History Social History   Tobacco Use  . Smoking status: Former Smoker    Packs/day: 1.50    Years: 15.00    Pack years: 22.50    Types: Cigarettes    Last attempt to quit: 06/19/2005    Years since quitting: 12.6  . Smokeless tobacco: Current User    Types: Chew  Substance Use Topics  . Alcohol use: Yes    Alcohol/week: 4.0 - 5.0 standard drinks    Types: 4 - 5 Cans of beer per week  . Drug use: Not on file     Review of Systems  Constitutional: No fever/chills Cardiovascular: No chest pain. Respiratory: No SOB. Gastrointestinal: No abdominal pain.  No nausea, no vomiting.  Musculoskeletal: Positive for shoulder pain.  Skin: Negative for rash, abrasions, lacerations, ecchymosis. Neurological: Negative for headaches, numbness or tingling   ____________________________________________   PHYSICAL EXAM:  VITAL SIGNS: ED Triage Vitals  Enc Vitals Group     BP 02/07/18 1543 (!) 166/72     Pulse Rate 02/07/18 1543 67     Resp 02/07/18 1543 20     Temp 02/07/18 1543 98.2 F (36.8 C)     Temp Source 02/07/18 1543 Oral     SpO2 02/07/18 1543 99 %     Weight 02/07/18 1544 150 lb (68 kg)     Height 02/07/18 1544 5\' 10"  (1.778 m)  Head Circumference --      Peak Flow --      Pain Score 02/07/18 1548 7     Pain Loc --      Pain Edu? --      Excl. in [place]? --      Constitutional: Alert and oriented. Well appearing and in no acute distress. Eyes: Conjunctivae are normal. PERRL. EOMI. Head: Atraumatic. ENT:      Ears:      Nose: No congestion/rhinnorhea.      Mouth/Throat: Mucous membranes are moist.  Neck: No stridor.  No cervical spine tenderness to palpation. Cardiovascular: Normal rate, regular rhythm.  Good peripheral circulation. Respiratory: Normal respiratory effort without tachypnea or retractions. Lungs CTAB. Good air entry to the bases with no decreased or absent breath sounds. Gastrointestinal: Bowel sounds 4 quadrants. Soft and nontender  to palpation. No guarding or rigidity. No palpable masses. No distention. Musculoskeletal: Full range of motion to all extremities. No gross deformities appreciated.  Pain elicited with range of motion of right shoulder.  Strength equal in upper extremities bilaterally.  Tenderness to palpation primarily over anterior shoulder. Neurologic:  Normal speech and language. No gross focal neurologic deficits are appreciated.  Skin:  Skin is warm, dry and intact. No rash noted. Psychiatric: Mood and affect are normal. Speech and behavior are normal. Patient exhibits appropriate insight and judgement.   ____________________________________________   LABS (all labs ordered are listed, but only abnormal results are displayed)  Labs Reviewed  BASIC METABOLIC PANEL - Abnormal; Notable for the following components:      Result Value   Glucose, Bld 227 (*)    Creatinine, Ser 1.50 (*)    GFR calc non Af Amer 43 (*)    GFR calc Af Amer 50 (*)    All other components within normal limits  CBC   ____________________________________________  EKG   ____________________________________________  RADIOLOGY Robinette Haines, personally viewed and evaluated these images (plain radiographs) as part of my medical decision making, as well as reviewing the written report by the radiologist.  Dg Shoulder Right  Result Date: 02/07/2018 CLINICAL DATA:  Right shoulder pain post fall 2 months ago. EXAM: RIGHT SHOULDER - 2+ VIEW COMPARISON:  None. FINDINGS: Degenerative change of the [facility] joint and mild degenerate change of the glenohumeral joint. No fracture or dislocation. IMPRESSION: No acute findings. Mild degenerative changes. Electronically Signed   By: Marin Olp M.D.   On: 02/07/2018 16:40    ____________________________________________    PROCEDURES  Procedure(s) performed:    Procedures    Medications - No data to display   ____________________________________________   INITIAL  IMPRESSION / ASSESSMENT AND PLAN / ED COURSE  Pertinent labs & imaging results that were available during my care of the patient were reviewed by me and considered in my medical decision making (see chart for details).  Review of the [place] CSRS was performed in accordance of the [place] prior to dispensing any controlled drugs.   Patient's diagnosis is consistent with rotator cuff injury.  Vital signs and exam are reassuring.  Shoulder xray negative for bony abnormality. Blood work was drawn in triage.  Patient has a slightly elevated creatinine and decreased GFR.  This is likely chronic as a result of his diabetes.  Patient has not taken any medication for his diabetes in years.  Education about diabetes was given and patient and son were educated about risk to his kidneys.  Patient and son are agreeable to follow-up with  primary care.  Patient will be discharged home with prescriptions for metformin. Patient is to follow up with PCP as directed. Patient is given ED precautions to return to the ED for any worsening or new symptoms.     ____________________________________________  FINAL CLINICAL IMPRESSION(S) / ED DIAGNOSES  Final diagnoses:  Acute pain of right shoulder  Type 2 diabetes mellitus with complication, without long-term current use of insulin (HCC)      NEW MEDICATIONS STARTED DURING THIS VISIT:  ED Discharge Orders         Ordered    metFORMIN (GLUCOPHAGE) 500 MG tablet  2 times daily with meals     02/07/18 1726    acetaminophen (TYLENOL) 500 MG tablet  Every 6 hours PRN     02/07/18 1726              This chart was dictated using voice recognition software/Dragon. Despite best efforts to proofread, errors can occur which can change the meaning. Any change was purely unintentional.    Laban Emperor, PA-C 02/07/18 2325    Arta Silence, MD 02/07/18 [phone]

## 2018-02-07 NOTE — ED Notes (Signed)
Lavender and Light Green top sent at this time.

## 2018-02-12 DIAGNOSIS — M19011 Primary osteoarthritis, right shoulder: Secondary | ICD-10-CM | POA: Diagnosis not present

## 2018-03-05 ENCOUNTER — Ambulatory Visit: Payer: Medicare Other | Admitting: Family Medicine

## 2018-03-07 ENCOUNTER — Encounter: Payer: Self-pay | Admitting: Family Medicine

## 2018-03-07 ENCOUNTER — Ambulatory Visit (INDEPENDENT_AMBULATORY_CARE_PROVIDER_SITE_OTHER): Payer: Medicare Other | Admitting: Family Medicine

## 2018-03-07 VITALS — BP 112/78 | HR 56 | Temp 97.6°F | Ht 70.0 in | Wt 140.2 lb

## 2018-03-07 DIAGNOSIS — E119 Type 2 diabetes mellitus without complications: Secondary | ICD-10-CM | POA: Diagnosis not present

## 2018-03-07 DIAGNOSIS — Z23 Encounter for immunization: Secondary | ICD-10-CM | POA: Diagnosis not present

## 2018-03-07 DIAGNOSIS — E1165 Type 2 diabetes mellitus with hyperglycemia: Secondary | ICD-10-CM | POA: Insufficient documentation

## 2018-03-07 LAB — COMPREHENSIVE METABOLIC PANEL
ALT: 10 U/L (ref 0–53)
AST: 13 U/L (ref 0–37)
Albumin: 4 g/dL (ref 3.5–5.2)
Alkaline Phosphatase: 56 U/L (ref 39–117)
BUN: 18 mg/dL (ref 6–23)
CALCIUM: 9.8 mg/dL (ref 8.4–10.5)
CHLORIDE: 99 meq/L (ref 96–112)
CO2: 32 mEq/L (ref 19–32)
CREATININE: 1.11 mg/dL (ref 0.40–1.50)
GFR: 68.42 mL/min (ref >60.00)
GLUCOSE: 123 mg/dL — AB (ref 70–99)
Potassium: 4.8 mEq/L (ref 3.5–5.1)
Sodium: 139 mEq/L (ref 135–145)
Total Bilirubin: 0.4 mg/dL (ref 0.2–1.2)
Total Protein: 6.9 g/dL (ref 6.0–8.3)

## 2018-03-07 LAB — HEMOGLOBIN A1C: HEMOGLOBIN A1C: 7.8 % — AB (ref 4.6–6.5)

## 2018-03-07 MED ORDER — METFORMIN HCL 500 MG PO TABS: 500.0000 mg | ORAL_TABLET | Freq: Two times a day (BID) | ORAL | 3 refills | Status: DC

## 2018-03-07 NOTE — Progress Notes (Signed)
Subjective:    Patient ID: Eric Morris, male    DOB: 03-31-1941, 77 y.o.   MRN: [id number]  HPI  Patient presents to clinic to establish care with new PCP.  He is a diabetic, has used metformin 500 mg twice daily for many years without any bad effect.  Patient does not regularly check blood sugars, goes mainly by how he feels.  Patient did take lisinopril and atorvastatin approximately 2 years ago after TIA, but states has not taken this medication in over a year.  Patient recently was in the emergency room at the end of August due to pain in right shoulder.  X-ray done in the ER did not not find anything acutely wrong with shoulder, did show mild degenerative changes.  Patient diet noticed with rotator cuff injury by the emergency department. Shoulder pain has improved.   Lab work done in ER revealed a normal CBC and basic metabolic panel did show decreased kidney functions.  CBC Latest Ref Rng & Units 02/07/2018 04/14/2016 04/13/2016  WBC 3.8 - 10.6 K/uL 5.8 7.6 7.3  Hemoglobin 13.0 - 18.0 g/dL 15.9 14.4 13.8  Hematocrit 40.0 - 52.0 % 45.7 42.3 39.7(L)  Platelets 150 - 440 K/uL 272 212 214   BMP Latest Ref Rng & Units 02/07/2018 04/14/2016 04/13/2016  Glucose 70 - 99 mg/dL 227(H) 163(H) 172(H)  BUN 8 - 23 mg/dL 21 14 17   Creatinine 0.61 - 1.24 mg/dL 1.50(H) 1.03 1.02  Sodium 135 - 145 mmol/L 138 138 139  Potassium 3.5 - 5.1 mmol/L 4.3 3.6 3.4(L)  Chloride 98 - 111 mmol/L 100 105 105  CO2 22 - 32 mmol/L 28 27 29   Calcium 8.9 - 10.3 mg/dL 9.3 8.7(L) 8.4(L)  GFR    >68mL/min  43  Past Surgical History:  Procedure Laterality Date  . BACK SURGERY    . HERNIA REPAIR      Review of Systems  Constitutional: Negative for chills, fatigue and fever.  HENT: Negative for congestion, ear pain, sinus pain and sore throat.   Eyes: Negative.   Respiratory: Negative for cough, shortness of breath and wheezing.   Cardiovascular: Negative for chest pain, palpitations and leg swelling.    Gastrointestinal: Negative for abdominal pain, diarrhea, nausea and vomiting.  Genitourinary: Negative for dysuria, frequency and urgency.  Musculoskeletal: Negative for arthralgias and myalgias.  Skin: Negative for color change, pallor and rash.  Neurological: Negative for syncope, light-headedness and headaches.  Psychiatric/Behavioral: The patient is not nervous/anxious.       Objective:   Physical Exam  Constitutional: He is oriented to person, place, and time. He appears well-nourished. No distress.  HENT:  Head: Normocephalic and atraumatic.  Right Ear: External ear normal.  Left Ear: External ear normal.  Mouth/Throat: Oropharynx is clear and moist. No oropharyngeal exudate.  Eyes: Pupils are equal, round, and reactive to light. EOM are normal. No scleral icterus.  Neck: Neck supple. No tracheal deviation present.  Cardiovascular: Normal rate and regular rhythm.  Pulses:      Dorsalis pedis pulses are 2+ on the right side, and 2+ on the left side.       Posterior tibial pulses are 2+ on the right side, and 2+ on the left side.  Pulmonary/Chest: Effort normal and breath sounds normal. No respiratory distress.  Musculoskeletal: He exhibits no edema, tenderness or deformity.       Left foot: There is no deformity.  Gait normal.   Feet:  Right Foot:  Protective Sensation:  4 sites tested. 4 sites sensed.  Skin Integrity: Negative for ulcer, blister, skin breakdown or erythema.  Left Foot:  Protective Sensation: 4 sites tested. 4 sites sensed.  Skin Integrity: Negative for ulcer, blister, skin breakdown or erythema.  Neurological: He is alert and oriented to person, place, and time.  Skin: Skin is warm and dry. No pallor.  Psychiatric: He has a normal mood and affect. His behavior is normal.  Nursing note and vitals reviewed.  Vitals:   03/07/18 1248  BP: 112/78  Pulse: (!) 56  Temp: 97.6 F (36.4 C)  SpO2: 98%      Assessment & Plan:    Type 2 DM --patient will  continue metformin 500 mg twice daily.  We will get new basic metabolic panel and Q9I lab work drawn today.  Healthy diet reviewed. I will place opthamology referral due to diabetes diagnosis for dilated retinal exam.   Right shoulder pain- shoulder pain has improved, patient advised he can use Tylenol as needed if pain recurs.  Flu vaccine given in clinic today.  Patient will return to clinic in approximately 6 months for follow-up on chronic conditions.  Advised to return to clinic anytime if illness or other issues arise.

## 2018-04-17 DIAGNOSIS — E119 Type 2 diabetes mellitus without complications: Secondary | ICD-10-CM | POA: Diagnosis not present

## 2018-04-19 DIAGNOSIS — H3500 Unspecified background retinopathy: Secondary | ICD-10-CM

## 2018-04-19 HISTORY — DX: Unspecified background retinopathy: H35.00

## 2018-04-25 ENCOUNTER — Encounter: Payer: Self-pay | Admitting: Lab

## 2018-08-05 DIAGNOSIS — H25041 Posterior subcapsular polar age-related cataract, right eye: Secondary | ICD-10-CM | POA: Diagnosis not present

## 2018-08-05 DIAGNOSIS — E119 Type 2 diabetes mellitus without complications: Secondary | ICD-10-CM | POA: Diagnosis not present

## 2018-08-12 ENCOUNTER — Encounter: Payer: Self-pay | Admitting: *Deleted

## 2018-08-14 DIAGNOSIS — H25041 Posterior subcapsular polar age-related cataract, right eye: Secondary | ICD-10-CM | POA: Diagnosis not present

## 2018-08-21 NOTE — Anesthesia Preprocedure Evaluation (Addendum)
Anesthesia Evaluation  Patient identified by MRN, date of birth, ID band Patient awake    Reviewed: Allergy & Precautions, H&P , NPO status , Patient's Chart, lab work & pertinent test results  History of Anesthesia Complications Negative for: history of anesthetic complications  Airway Mallampati: II       Dental  (+) Teeth Intact   Pulmonary neg COPD, former smoker,           Cardiovascular (-) angina(-) Past MI and (-) Cardiac Stents negative cardio ROS  (-) dysrhythmias      Neuro/Psych TIACVA negative neurological ROS  negative psych ROS   GI/Hepatic negative GI ROS, Neg liver ROS,   Endo/Other  diabetes  Renal/GU      Musculoskeletal   Abdominal   Peds  Hematology negative hematology ROS (+)   Anesthesia Other Findings Past Medical History: No date: Arthritis No date: Diabetes mellitus without complication ([place]) No date: Pain     Comment:  RIGHT SHOULDER  04/19/2018: Retinopathy     Comment:  No diabetic Retinopathy No date: Stroke [facility])     Comment:  TIA 2017  Past Surgical History: No date: BACK SURGERY No date: HERNIA REPAIR  BMI    Body Mass Index:  20.81 kg/m      Reproductive/Obstetrics negative OB ROS                            Anesthesia Physical Anesthesia Plan  ASA: II  Anesthesia Plan: MAC   Post-op Pain Management:    Induction:   PONV Risk Score and Plan:   Airway Management Planned:   Additional Equipment:   Intra-op Plan:   Post-operative Plan:   Informed Consent: I have reviewed the patients History and Physical, chart, labs and discussed the procedure including the risks, benefits and alternatives for the proposed anesthesia with the patient or authorized representative who has indicated his/her understanding and acceptance.       Plan Discussed with: Anesthesiologist and CRNA  Anesthesia Plan Comments:         Anesthesia  Quick Evaluation

## 2018-08-22 ENCOUNTER — Other Ambulatory Visit: Payer: Self-pay

## 2018-08-22 ENCOUNTER — Ambulatory Visit: Payer: Medicare Other | Admitting: Anesthesiology

## 2018-08-22 ENCOUNTER — Encounter: Payer: Self-pay | Admitting: *Deleted

## 2018-08-22 ENCOUNTER — Encounter
Admission: RE | Disposition: A | Discharge status: Home / Self Care | Payer: Self-pay | Source: Home / Self Care | Attending: Ophthalmology

## 2018-08-22 ENCOUNTER — Ambulatory Visit
Admission: RE | Admit: 2018-08-22 | Discharge: 2018-08-22 | Disposition: A | Discharge status: Home / Self Care | Payer: Medicare Other | Attending: Ophthalmology | Admitting: Ophthalmology

## 2018-08-22 DIAGNOSIS — E1136 Type 2 diabetes mellitus with diabetic cataract: Secondary | ICD-10-CM | POA: Diagnosis not present

## 2018-08-22 DIAGNOSIS — Z79811 Long term (current) use of aromatase inhibitors: Secondary | ICD-10-CM | POA: Insufficient documentation

## 2018-08-22 DIAGNOSIS — Z7984 Long term (current) use of oral hypoglycemic drugs: Secondary | ICD-10-CM | POA: Insufficient documentation

## 2018-08-22 DIAGNOSIS — Z8673 Personal history of transient ischemic attack (TIA), and cerebral infarction without residual deficits: Secondary | ICD-10-CM | POA: Insufficient documentation

## 2018-08-22 DIAGNOSIS — Z87891 Personal history of nicotine dependence: Secondary | ICD-10-CM | POA: Insufficient documentation

## 2018-08-22 DIAGNOSIS — H2511 Age-related nuclear cataract, right eye: Secondary | ICD-10-CM | POA: Insufficient documentation

## 2018-08-22 DIAGNOSIS — H25041 Posterior subcapsular polar age-related cataract, right eye: Secondary | ICD-10-CM | POA: Diagnosis not present

## 2018-08-22 HISTORY — DX: Pain, unspecified: R52

## 2018-08-22 HISTORY — DX: Unspecified osteoarthritis, unspecified site: M19.90

## 2018-08-22 HISTORY — DX: Cerebral infarction, unspecified: I63.9

## 2018-08-22 HISTORY — DX: Type 2 diabetes mellitus without complications: E11.9

## 2018-08-22 HISTORY — PX: CATARACT EXTRACTION W/PHACO: SHX586

## 2018-08-22 LAB — GLUCOSE, CAPILLARY: Glucose-Capillary: 138 mg/dL — ABNORMAL HIGH (ref 70–99)

## 2018-08-22 SURGERY — PHACOEMULSIFICATION, CATARACT, WITH IOL INSERTION
Anesthesia: Monitor Anesthesia Care | Site: Eye | Laterality: Right

## 2018-08-22 MED ORDER — ARMC OPHTHALMIC DILATING DROPS
OPHTHALMIC | Status: AC
  Administered 2018-08-22: 1 via OPHTHALMIC
  Filled 2018-08-22: qty 0.5

## 2018-08-22 MED ORDER — ACETAZOLAMIDE SODIUM 500 MG IJ SOLR
INTRAMUSCULAR | Status: AC
  Filled 2018-08-22: qty 500

## 2018-08-22 MED ORDER — MOXIFLOXACIN HCL 0.5 % OP SOLN: 1.0000 [drp] | OPHTHALMIC | Status: DC | PRN

## 2018-08-22 MED ORDER — BSS IO SOLN
INTRAOCULAR | Status: DC | PRN
  Administered 2018-08-22: 15 mL via INTRAOCULAR

## 2018-08-22 MED ORDER — MOXIFLOXACIN HCL 0.5 % OP SOLN
OPHTHALMIC | Status: AC
  Filled 2018-08-22: qty 3

## 2018-08-22 MED ORDER — NA HYALUR & NA CHOND-NA HYALUR 0.55-0.5 ML IO KIT
PACK | INTRAOCULAR | Status: DC | PRN
  Administered 2018-08-22: 1 via OPHTHALMIC

## 2018-08-22 MED ORDER — NA CHONDROIT SULF-NA HYALURON 40-17 MG/ML IO SOLN
INTRAOCULAR | Status: AC
  Filled 2018-08-22: qty 1

## 2018-08-22 MED ORDER — NA HYALUR & NA CHOND-NA HYALUR 0.55-0.5 ML IO KIT
PACK | INTRAOCULAR | Status: AC
  Filled 2018-08-22: qty 1.05

## 2018-08-22 MED ORDER — ARMC OPHTHALMIC DILATING DROPS
1.0000 "application " | OPHTHALMIC | Status: AC
  Administered 2018-08-22 (×2): 1 via OPHTHALMIC

## 2018-08-22 MED ORDER — EPINEPHRINE PF 1 MG/ML IJ SOLN
INTRAOCULAR | Status: DC | PRN
  Administered 2018-08-22: 08:00:00 via OPHTHALMIC

## 2018-08-22 MED ORDER — POVIDONE-IODINE 5 % OP SOLN
OPHTHALMIC | Status: AC
  Filled 2018-08-22: qty 30

## 2018-08-22 MED ORDER — POVIDONE-IODINE 5 % OP SOLN
OPHTHALMIC | Status: DC | PRN
  Administered 2018-08-22: 1 via OPHTHALMIC

## 2018-08-22 MED ORDER — EPINEPHRINE PF 1 MG/ML IJ SOLN
INTRAMUSCULAR | Status: AC
  Filled 2018-08-22: qty 2

## 2018-08-22 MED ORDER — CARBACHOL 0.01 % IO SOLN
INTRAOCULAR | Status: DC | PRN
  Administered 2018-08-22: 0.5 mL via INTRAOCULAR

## 2018-08-22 MED ORDER — LIDOCAINE HCL (PF) 4 % IJ SOLN
INTRAMUSCULAR | Status: AC
  Filled 2018-08-22: qty 5

## 2018-08-22 MED ORDER — LIDOCAINE HCL (PF) 4 % IJ SOLN
INTRAOCULAR | Status: DC | PRN
  Administered 2018-08-22: 4 mL via OPHTHALMIC

## 2018-08-22 MED ORDER — TRYPAN BLUE 0.06 % OP SOLN
OPHTHALMIC | Status: DC | PRN
  Administered 2018-08-22: 0.5 mL via INTRAOCULAR

## 2018-08-22 MED ORDER — NA CHONDROIT SULF-NA HYALURON 40-17 MG/ML IO SOLN
INTRAOCULAR | Status: DC | PRN
  Administered 2018-08-22: 1 mL via INTRAOCULAR

## 2018-08-22 MED ORDER — MOXIFLOXACIN HCL 0.5 % OP SOLN
OPHTHALMIC | Status: DC | PRN
  Administered 2018-08-22: 0.2 mL via OPHTHALMIC

## 2018-08-22 MED ORDER — TRYPAN BLUE 0.06 % OP SOLN
OPHTHALMIC | Status: AC
  Filled 2018-08-22: qty 0.5

## 2018-08-22 MED ORDER — TETRACAINE HCL 0.5 % OP SOLN
OPHTHALMIC | Status: AC
  Administered 2018-08-22: 1 [drp] via OPHTHALMIC
  Filled 2018-08-22: qty 4

## 2018-08-22 MED ORDER — MIDAZOLAM HCL 2 MG/2ML IJ SOLN
INTRAMUSCULAR | Status: DC | PRN
  Administered 2018-08-22 (×2): 1 mg via INTRAVENOUS

## 2018-08-22 MED ORDER — MIDAZOLAM HCL 2 MG/2ML IJ SOLN
INTRAMUSCULAR | Status: AC
  Filled 2018-08-22: qty 2

## 2018-08-22 MED ORDER — SODIUM CHLORIDE 0.9 % IV SOLN
INTRAVENOUS | Status: DC
  Administered 2018-08-22: 07:00:00 via INTRAVENOUS

## 2018-08-22 MED ORDER — TETRACAINE HCL 0.5 % OP SOLN
1.0000 [drp] | OPHTHALMIC | Status: AC | PRN
  Administered 2018-08-22 (×3): 1 [drp] via OPHTHALMIC

## 2018-08-22 SURGICAL SUPPLY — 19 items
DISSECTOR HYDRO NUCLEUS 50X22 (MISCELLANEOUS) ×8 IMPLANT
DRSG TEGADERM 2-3/8X2-3/4 SM (GAUZE/BANDAGES/DRESSINGS) ×2 IMPLANT
GLOVE BIOGEL M 6.5 STRL (GLOVE) ×2 IMPLANT
GOWN STRL REUS W/ TWL LRG LVL3 (GOWN DISPOSABLE) ×1 IMPLANT
GOWN STRL REUS W/ TWL XL LVL3 (GOWN DISPOSABLE) ×1 IMPLANT
GOWN STRL REUS W/TWL LRG LVL3 (GOWN DISPOSABLE) ×1
GOWN STRL REUS W/TWL XL LVL3 (GOWN DISPOSABLE) ×1
KNIFE 45D UP 2.3 (MISCELLANEOUS) ×2 IMPLANT
LABEL CATARACT MEDS ST (LABEL) ×2 IMPLANT
LENS IOL ACRSF IQ ULTRA 23.5 (Intraocular Lens) ×1 IMPLANT
LENS IOL ACRYSOF IQ 23.5 (Intraocular Lens) ×2 IMPLANT
PACK CATARACT (MISCELLANEOUS) ×2 IMPLANT
PACK CATARACT KING (MISCELLANEOUS) ×2 IMPLANT
PACK EYE AFTER SURG (MISCELLANEOUS) ×2 IMPLANT
SOL BSS BAG (MISCELLANEOUS) ×2
SOLUTION BSS BAG (MISCELLANEOUS) ×1 IMPLANT
SYR 5ML LL (SYRINGE) ×2 IMPLANT
WATER STERILE IRR 250ML POUR (IV SOLUTION) ×2 IMPLANT
WIPE NON LINTING 3.25X3.25 (MISCELLANEOUS) ×2 IMPLANT

## 2018-08-22 NOTE — H&P (Signed)
   I have reviewed the patient's H&P and agree with its findings. There have been no interval changes.  Sherilynn Dieu MD Ophthalmology 

## 2018-08-22 NOTE — Discharge Instructions (Signed)
Eye Surgery Discharge Instructions    Expect mild scratchy sensation or mild soreness. DO NOT RUB YOUR EYE!  The day of surgery:  Minimal physical activity, but bed rest is not required  No reading, computer work, or close hand work  No bending, lifting, or straining.  May watch TV  For 24 hours:  No driving, legal decisions, or alcoholic beverages  Safety precautions  Eat anything you prefer: It is better to start with liquids, then soup then solid foods.  _____ Eye patch should be worn until postoperative exam tomorrow.  ____ Solar shield eyeglasses should be worn for comfort in the sunlight/patch while sleeping  Resume all regular medications including aspirin or Coumadin if these were discontinued prior to surgery. You may shower, bathe, shave, or wash your hair. Tylenol may be taken for mild discomfort.  Call your doctor if you experience significant pain, nausea, or vomiting, fever > 101 or other signs of infection. [phone] or [phone] Specific instructions:  Follow-up Information    Marchia Meiers, MD Follow up on 08/23/2018.   Specialty:  Ophthalmology Why:  9:30 Contact information: [address] [place] [address] [phone]

## 2018-08-22 NOTE — Transfer of Care (Signed)
Immediate Anesthesia Transfer of Care Note  Patient: Eric Morris  Procedure(s) Performed: CATARACT EXTRACTION PHACO AND INTRAOCULAR LENS PLACEMENT (IOC)-RIGHT (Right Eye)  Patient Location: PACU  Anesthesia Type:MAC  Level of Consciousness: sedated  Airway & Oxygen Therapy: Patient Spontanous Breathing  Post-op Assessment: Report given to RN and Post -op Vital signs reviewed and stable  Post vital signs: Reviewed and stable  Last Vitals:  Vitals Value Taken Time  BP 138/71 08/22/2018  8:36 AM  Temp 36.9 C 08/22/2018  8:36 AM  Pulse 51 08/22/2018  8:36 AM  Resp 16 08/22/2018  8:36 AM  SpO2 100 % 08/22/2018  8:36 AM    Last Pain:  Vitals:   08/22/18 0836  TempSrc: Oral  PainSc: 0-No pain         Complications: No apparent anesthesia complications

## 2018-08-22 NOTE — Anesthesia Post-op Follow-up Note (Signed)
Anesthesia QCDR form completed.        

## 2018-08-22 NOTE — Op Note (Signed)
  PREOPERATIVE DIAGNOSIS:  Nuclear sclerotic cataract of the RIGHT eye.   POSTOPERATIVE DIAGNOSIS:  Nuclear sclerotic cataract of the RIGHT eye.   OPERATIVE PROCEDURE: Cataract surgery OD   SURGEON:  Marchia Meiers, MD.   ANESTHESIA:  Anesthesiologist: Durenda Hurt, MD CRNA: Justus Memory, CRNA; Babs Sciara, CRNA  1.      Managed anesthesia care. 2.     0.29ml of Shugarcaine was instilled following the paracentesis   COMPLICATIONS:  None.   TECHNIQUE:   Divide and conquer   DESCRIPTION OF PROCEDURE:  The patient was examined and consented in the preoperative holding area where the aforementioned topical anesthesia was applied to the RIGHT eye and then brought back to the Operating Room where the RIGHT eye was prepped and draped in the usual sterile ophthalmic fashion and a lid speculum was placed. A paracentesis was created with the side port blade, the anterior chamber was washed out with trypan blue to stain the anterior capsule, and the anterior chamber was filled with viscoelastic. A near clear corneal incision was performed with the steel keratome. A continuous curvilinear capsulorrhexis was performed with a cystotome followed by the capsulorrhexis forceps. Hydrodissection and hydrodelineation were carried out with BSS on a blunt cannula. The lens was removed in a divide and conquer  technique and the remaining cortical material was removed with the irrigation-aspiration handpiece. The capsular bag was inflated with viscoelastic and the lens was placed in the capsular bag without complication. The remaining viscoelastic was removed from the eye with the irrigation-aspiration handpiece. The wounds were hydrated. The anterior chamber was flushed and the eye was inflated to physiologic pressure. 0.47ml Vigamox was placed in the anterior chamber. The wounds were found to be water tight. The eye was dressed with Vigamox. The patient was given protective glasses to wear throughout the  day and a shield with which to sleep tonight. The patient was also given drops with which to begin a drop regimen today and will follow-up with me in one day. Implant Name Type Inv. Item Serial No. Manufacturer Lot No. LRB No. Used  LENS IOL ACRYSOF IQ 23.5 - [id number] Intraocular Lens LENS IOL ACRYSOF IQ 23.5 [id number] [employer]CON  Right 1    Procedure(s) with comments: CATARACT EXTRACTION PHACO AND INTRAOCULAR LENS PLACEMENT (IOC)-RIGHT (Right) - [place] 02:18.0 CDE 24.65 Fluid Pack Lot # [id number]  Electronically signed: Marchia Meiers 08/22/2018 8:29 AM

## 2018-08-23 NOTE — Anesthesia Postprocedure Evaluation (Signed)
Anesthesia Post Note  Patient: Eric Morris  Procedure(s) Performed: CATARACT EXTRACTION PHACO AND INTRAOCULAR LENS PLACEMENT (IOC)-RIGHT (Right Eye)  Patient location during evaluation: PACU Anesthesia Type: MAC Level of consciousness: awake and alert Pain management: pain level controlled Vital Signs Assessment: post-procedure vital signs reviewed and stable Respiratory status: spontaneous breathing, nonlabored ventilation and respiratory function stable Cardiovascular status: stable and blood pressure returned to baseline Postop Assessment: no apparent nausea or vomiting Anesthetic complications: no     Last Vitals:  Vitals:   08/22/18 0836 08/22/18 0843  BP: 138/71 124/79  Pulse: (!) 51 (!) 51  Resp: 16 16  Temp: 36.9 C   SpO2: 100% 100%    Last Pain:  Vitals:   08/22/18 0843  TempSrc:   PainSc: 0-No pain                 Durenda Hurt

## 2018-09-05 IMAGING — US US CAROTID DUPLEX BILAT
1 series · 13 of 24 positions shown · non-contrast
Comparison: MRI 04/13/2016 .

CLINICAL DATA: TIA.

EXAM:
BILATERAL CAROTID DUPLEX ULTRASOUND
TECHNIQUE: Gray scale imaging, color Doppler and duplex ultrasound were
performed of bilateral carotid and vertebral arteries in the neck.

[Series 1: us carotid duplex bilat · 13 of 65 slices shown]
[im 1/65]
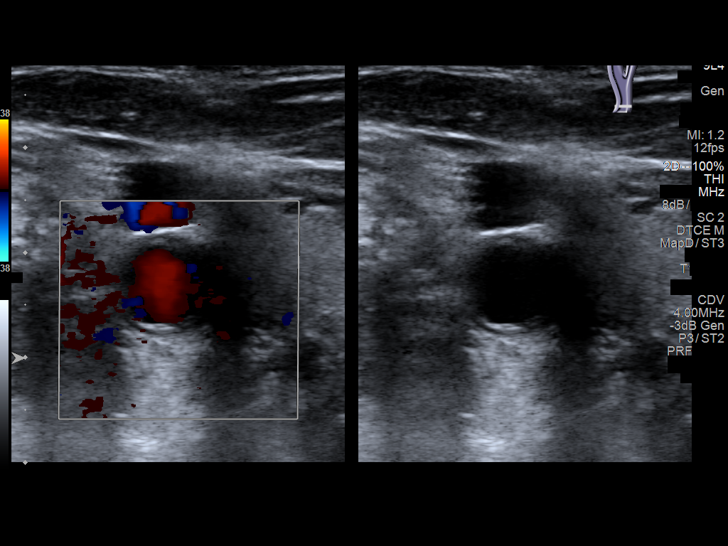
[im 6/65]
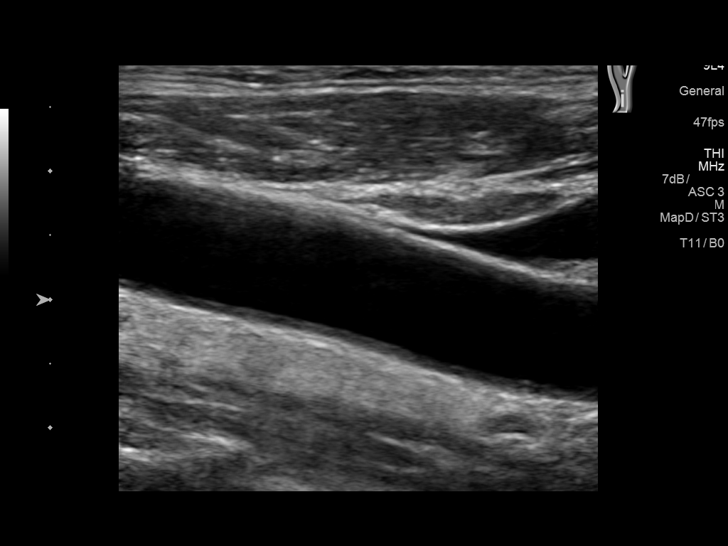
[im 12/65]
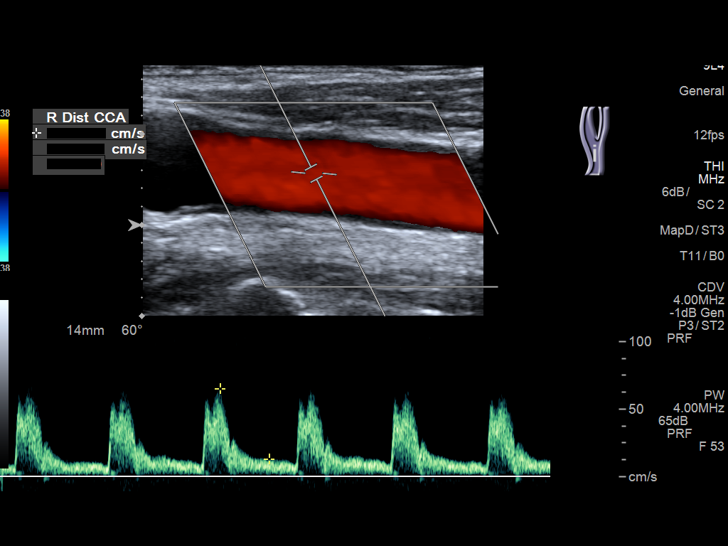
[im 17/65]
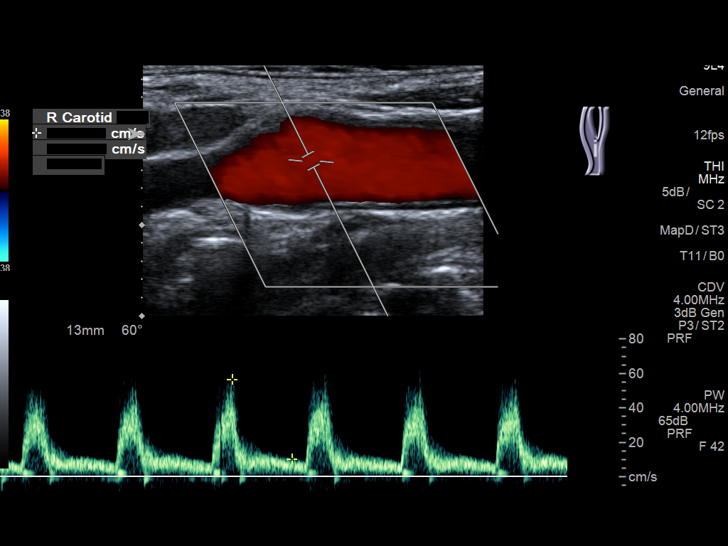
[im 23/65]
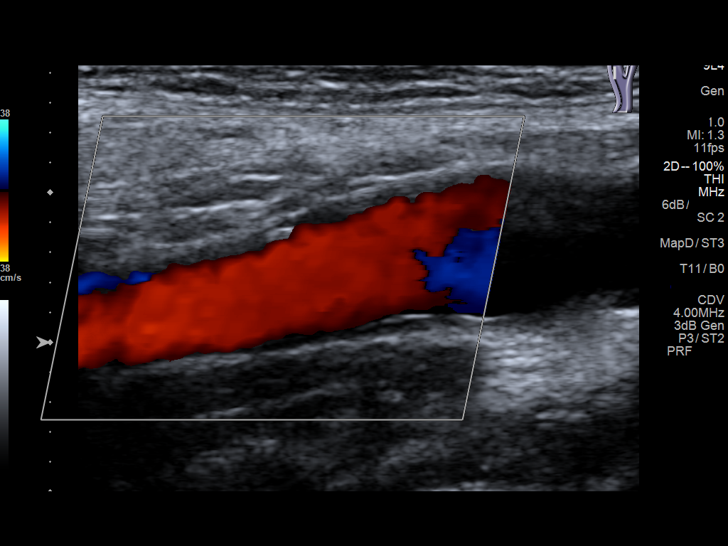
[im 28/65]
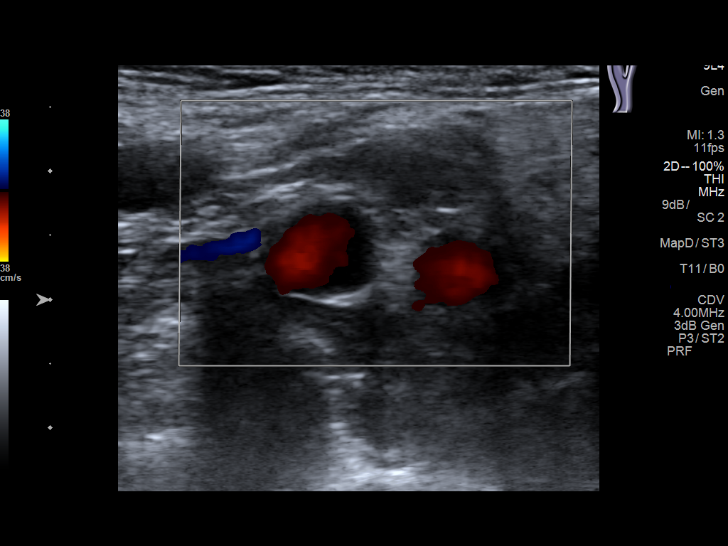
[im 34/65]
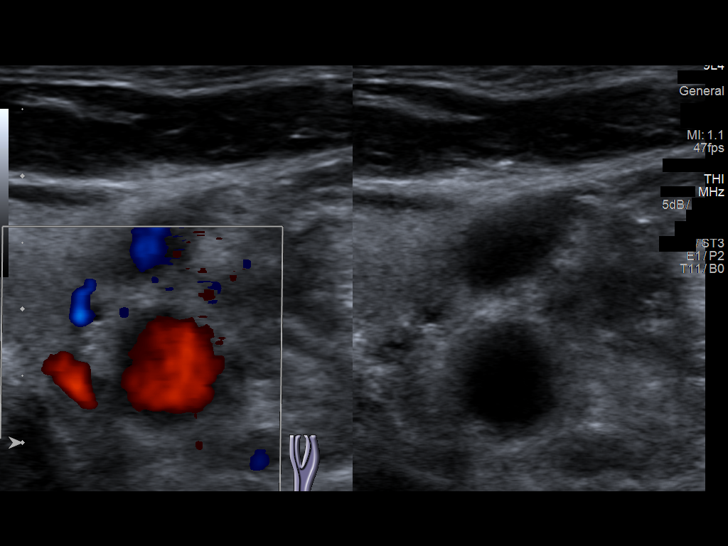
[im 37/65]
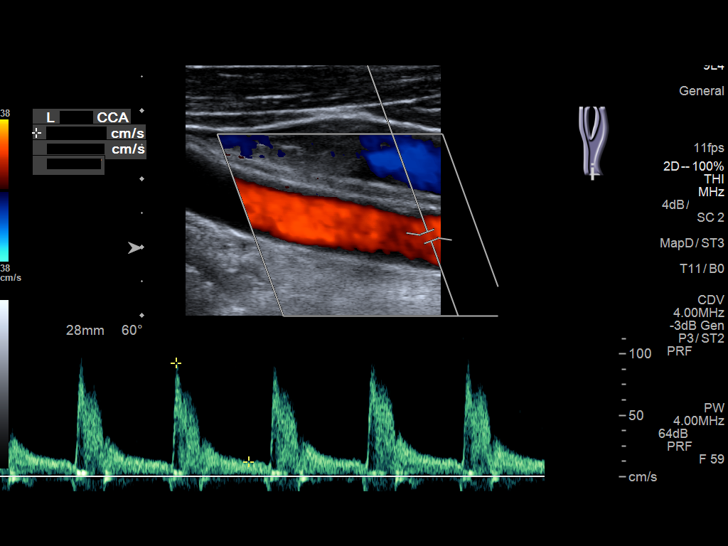
[im 42/65]
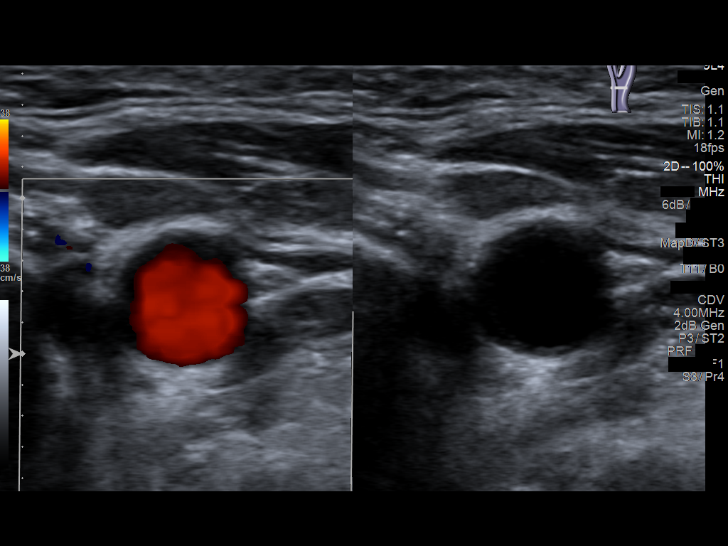
[im 48/65]
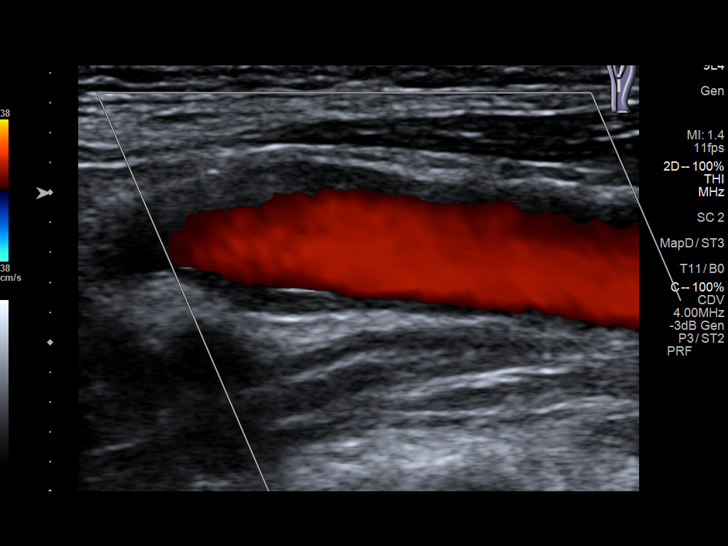
[im 53/65]
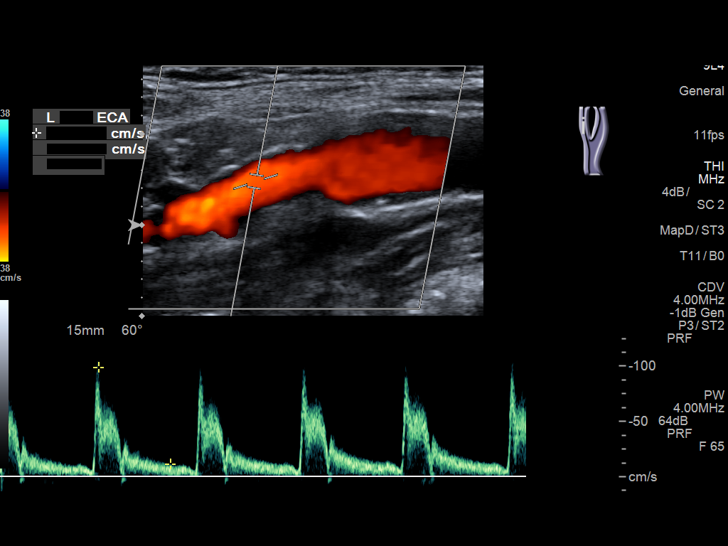
[im 59/65]
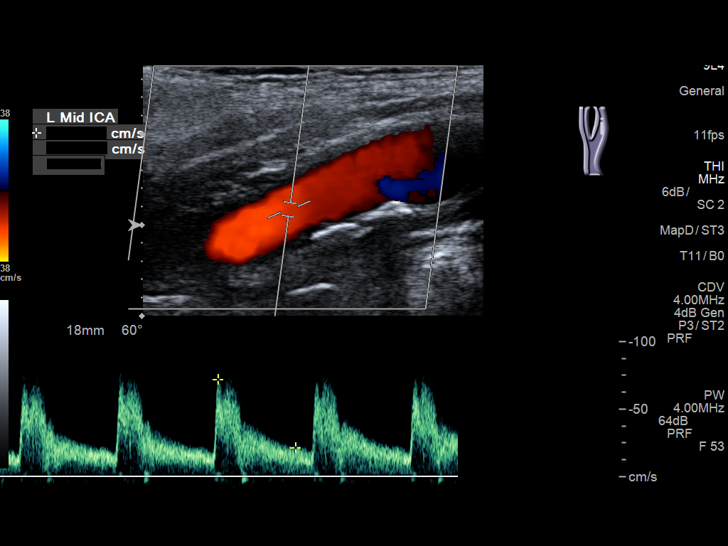
[im 65/65]
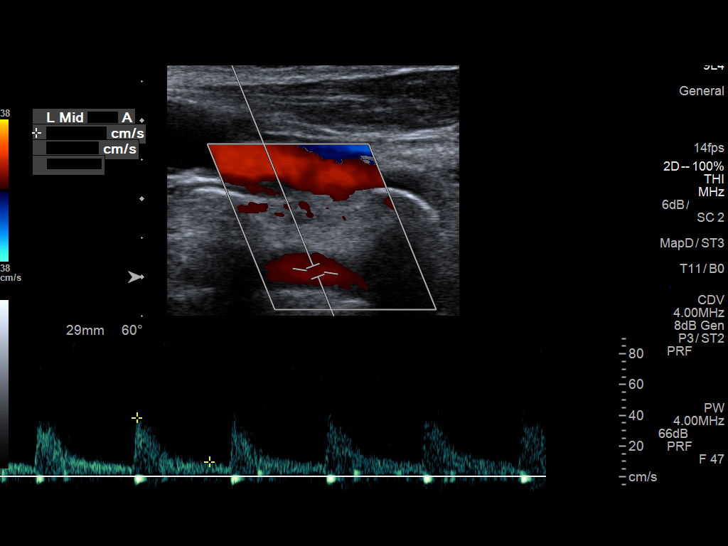

[13 of 24 positions shown; findings below may reference images not displayed]

FINDINGS: Criteria: Quantification of carotid stenosis is based on velocity
parameters that correlate the residual internal carotid diameter
with NASCET-based stenosis levels, using the diameter of the distal
internal carotid lumen as the denominator for stenosis measurement.

The following velocity measurements were obtained:

RIGHT

ICA:  69/a cm/sec

CCA:  83/14 cm/sec

SYSTOLIC ICA/CCA RATIO:

DIASTOLIC ICA/CCA RATIO:

ECA:  109 Cm/sec

LEFT

ICA:  81/22 cm/sec

CCA:  85/16 cm/sec

SYSTOLIC ICA/CCA RATIO:

DIASTOLIC ICA/CCA RATIO:

ECA:  99 cm/sec

RIGHT CAROTID ARTERY: Mild atherosclerotic vascular plaque. No flow
limiting stenosis. Degree of stenosis less than 50% .

RIGHT VERTEBRAL ARTERY:  Patent with antegrade flow.

LEFT CAROTID ARTERY: Mild atherosclerotic vascular plaque. No flow
limiting stenosis. Degree of stenosis less than 50%.

LEFT VERTEBRAL ARTERY:  Patent with antegrade flow.
IMPRESSION: 1. Mild bilateral atherosclerotic vascular plaque. No flow limiting
stenosis. Degree of stenosis less than 50% bilaterally.

2. Vertebral arteries are patent antegrade flow.

## 2018-09-09 ENCOUNTER — Other Ambulatory Visit: Payer: Self-pay

## 2018-09-09 ENCOUNTER — Ambulatory Visit (INDEPENDENT_AMBULATORY_CARE_PROVIDER_SITE_OTHER): Payer: Medicare Other | Admitting: Family Medicine

## 2018-09-09 ENCOUNTER — Encounter: Payer: Self-pay | Admitting: Family Medicine

## 2018-09-09 VITALS — BP 132/68 | HR 62 | Temp 98.4°F | Resp 18 | Ht 70.0 in | Wt 142.8 lb

## 2018-09-09 DIAGNOSIS — E119 Type 2 diabetes mellitus without complications: Secondary | ICD-10-CM | POA: Diagnosis not present

## 2018-09-09 LAB — HEMOGLOBIN A1C: HEMOGLOBIN A1C: 6.6 % — AB (ref 4.6–6.5)

## 2018-09-09 NOTE — Progress Notes (Signed)
Subjective:    Patient ID: Eric Morris, male    DOB: 15-Sep-1940, 78 y.o.   MRN: [id number]  HPI   Patient resents clinic for follow-up on his diabetes.  Patient does not check his blood sugars at home, he has a meter but does not like to use it.  He prefers to go more by how he is feeling.  Denies any episodes of sweating, shaking or feeling faint/dizzy.  Takes his metformin as prescribed. Tries to eat a healthy diet, son helps him buying healthy food at grocery store.    Patient Active Problem List   Diagnosis Date Noted  . Needs flu shot 03/07/2018  . Diabetes mellitus without complication ([name]) 93/71/6967  . CVA (cerebral vascular accident) ([place]) 04/13/2016  . TIA (transient ischemic attack) 04/12/2016   Social History   Tobacco Use  . Smoking status: Former Smoker    Packs/day: 1.50    Years: 15.00    Pack years: 22.50    Types: Cigarettes    Last attempt to quit: 06/19/2005    Years since quitting: 13.2  . Smokeless tobacco: Current User    Types: Chew  Substance Use Topics  . Alcohol use: Yes    Alcohol/week: 4.0 - 5.0 standard drinks    Types: 4 - 5 Cans of beer per week    Comment: OCCAS   Review of Systems  Constitutional: Negative for chills, fatigue and fever.  HENT: Negative for congestion, ear pain, sinus pain and sore throat.   Eyes: Negative.   Respiratory: Negative for cough, shortness of breath and wheezing.   Cardiovascular: Negative for chest pain, palpitations and leg swelling.  Gastrointestinal: Negative for abdominal pain, diarrhea, nausea and vomiting.  Genitourinary: Negative for dysuria, frequency and urgency.  Musculoskeletal: Negative for arthralgias and myalgias.  Skin: Negative for color change, pallor and rash.  Neurological: Negative for syncope, light-headedness and headaches.  Psychiatric/Behavioral: The patient is not nervous/anxious.       Objective:   Physical Exam  Constitutional: He appears well-developed and well-nourished.  No distress.  HENT:  Head: Normocephalic and atraumatic.  Eyes: Pupils are equal, round, and reactive to light. Conjunctivae and EOM are normal. No scleral icterus.  Neck: Normal range of motion. Neck supple. No tracheal deviation present.  Cardiovascular: Normal rate, regular rhythm and normal heart sounds.  Pulmonary/Chest: Effort normal and breath sounds normal. No respiratory distress. He has no wheezes. He has no rales.  Abdominal: Soft. Bowel sounds are normal. There is no tenderness.  Neurological: He is alert and oriented to person, place, and time.  Gait normal  Skin: Skin is warm and dry. He is not diaphoretic. No pallor.  Psychiatric: He has a normal mood and affect. His behavior is normal. Thought content normal.   Nursing note and vitals reviewed.  Today's Vitals   09/09/18 1311  BP: 132/68  Pulse: 62  Resp: 18  Temp: 98.4 F (36.9 C)  TempSrc: Oral  SpO2: 98%  Weight: 142 lb 12.8 oz (64.8 kg)  Height: 5\' 10"  (1.778 m)   Body mass index is 20.49 kg/m.     Assessment & Plan:   Type 2 diabetes - patient will continue to take metformin.  We will check a urine microalbumin today and follow-up on A1c.  Reviewed healthy diet and regular physical activity.  Discussed carb counting, eating a diet with lots of lean proteins and vegetables and less processed sugars and less processed carbohydrates.  Patient will follow-up here in  6 months for recheck on diabetes.  Patient advised to return to clinic sooner if any issues arise.

## 2018-09-09 NOTE — Patient Instructions (Addendum)
Carbohydrate Counting for Diabetes Mellitus, Adult  Carbohydrate counting is a method of keeping track of how many carbohydrates you eat. Eating carbohydrates naturally increases the amount of sugar (glucose) in the blood. Counting how many carbohydrates you eat helps keep your blood glucose within normal limits, which helps you manage your diabetes (diabetes mellitus). It is important to know how many carbohydrates you can safely have in each meal. This is different for every person. A diet and nutrition specialist (registered dietitian) can help you make a meal plan and calculate how many carbohydrates you should have at each meal and snack. Carbohydrates are found in the following foods:  Grains, such as breads and cereals.  Dried beans and soy products.  Starchy vegetables, such as potatoes, peas, and corn.  Fruit and fruit juices.  Milk and yogurt.  Sweets and snack foods, such as cake, cookies, candy, chips, and soft drinks. How do I count carbohydrates? There are two ways to count carbohydrates in food. You can use either of the methods or a combination of both. Reading "Nutrition Facts" on packaged food The "Nutrition Facts" list is included on the labels of almost all packaged foods and beverages in the [place] It includes:  The serving size.  Information about nutrients in each serving, including the grams (g) of carbohydrate per serving. To use the "Nutrition Facts":  Decide how many servings you will have.  Multiply the number of servings by the number of carbohydrates per serving.  The resulting number is the total amount of carbohydrates that you will be having. Learning standard serving sizes of other foods When you eat carbohydrate foods that are not packaged or do not include "Nutrition Facts" on the label, you need to measure the servings in order to count the amount of carbohydrates:  Measure the foods that you will eat with a food scale or measuring cup, if needed.   Decide how many standard-size servings you will eat.  Multiply the number of servings by 15. Most carbohydrate-rich foods have about 15 g of carbohydrates per serving. ? For example, if you eat 8 oz (170 g) of strawberries, you will have eaten 2 servings and 30 g of carbohydrates (2 servings x 15 g = 30 g).  For foods that have more than one food mixed, such as soups and casseroles, you must count the carbohydrates in each food that is included. The following list contains standard serving sizes of common carbohydrate-rich foods. Each of these servings has about 15 g of carbohydrates:   hamburger bun or  English muffin.   oz (15 mL) syrup.   oz (14 g) jelly.  1 slice of bread.  1 six-inch tortilla.  3 oz (85 g) cooked rice or pasta.  4 oz (113 g) cooked dried beans.  4 oz (113 g) starchy vegetable, such as peas, corn, or potatoes.  4 oz (113 g) hot cereal.  4 oz (113 g) mashed potatoes or  of a large baked potato.  4 oz (113 g) canned or frozen fruit.  4 oz (120 mL) fruit juice.  4-6 crackers.  6 chicken nuggets.  6 oz (170 g) unsweetened dry cereal.  6 oz (170 g) plain fat-free yogurt or yogurt sweetened with artificial sweeteners.  8 oz (240 mL) milk.  8 oz (170 g) fresh fruit or one small piece of fruit.  24 oz (680 g) popped popcorn. Example of carbohydrate counting Sample meal  3 oz (85 g) chicken breast.  6 oz (170 g)   brown rice.  4 oz (113 g) corn.  8 oz (240 mL) milk.  8 oz (170 g) strawberries with sugar-free whipped topping. Carbohydrate calculation 1. Identify the foods that contain carbohydrates: ? Rice. ? Corn. ? Milk. ? Strawberries. 2. Calculate how many servings you have of each food: ? 2 servings rice. ? 1 serving corn. ? 1 serving milk. ? 1 serving strawberries. 3. Multiply each number of servings by 15 g: ? 2 servings rice x 15 g = 30 g. ? 1 serving corn x 15 g = 15 g. ? 1 serving milk x 15 g = 15 g. ? 1 serving  strawberries x 15 g = 15 g. 4. Add together all of the amounts to find the total grams of carbohydrates eaten: ? 30 g + 15 g + 15 g + 15 g = 75 g of carbohydrates total. Summary  Carbohydrate counting is a method of keeping track of how many carbohydrates you eat.  Eating carbohydrates naturally increases the amount of sugar (glucose) in the blood.  Counting how many carbohydrates you eat helps keep your blood glucose within normal limits, which helps you manage your diabetes.  A diet and nutrition specialist (registered dietitian) can help you make a meal plan and calculate how many carbohydrates you should have at each meal and snack. This information is not intended to replace advice given to you by your health care provider. Make sure you discuss any questions you have with your health care provider. Document Released: 06/05/2005 Document Revised: 12/13/2016 Document Reviewed: 11/17/2015 Elsevier Interactive Patient Education  2019 [employer]  

## 2018-09-10 LAB — MICROALBUMIN / CREATININE URINE RATIO
Creatinine,U: 226.1 mg/dL
Microalb Creat Ratio: 1 mg/g (ref 0.0–30.0)
Microalb, Ur: 2.3 mg/dL — ABNORMAL HIGH (ref 0.0–1.9)

## 2018-09-19 ENCOUNTER — Ambulatory Visit: Admission: RE | Admit: 2018-09-19 | Payer: Medicare Other | Source: Home / Self Care

## 2018-09-19 ENCOUNTER — Encounter: Admission: RE | Payer: Self-pay | Source: Home / Self Care

## 2018-09-19 SURGERY — PHACOEMULSIFICATION, CATARACT, WITH IOL INSERTION
Anesthesia: Choice | Laterality: Left

## 2018-11-25 ENCOUNTER — Other Ambulatory Visit: Payer: Medicare Other

## 2018-12-02 ENCOUNTER — Other Ambulatory Visit
Admission: RE | Admit: 2018-12-02 | Discharge: 2018-12-02 | Disposition: A | Discharge status: Home / Self Care | Payer: Medicare Other | Source: Ambulatory Visit | Attending: Ophthalmology | Admitting: Ophthalmology

## 2018-12-02 ENCOUNTER — Other Ambulatory Visit: Payer: Self-pay

## 2018-12-02 DIAGNOSIS — Z1159 Encounter for screening for other viral diseases: Secondary | ICD-10-CM | POA: Insufficient documentation

## 2018-12-03 ENCOUNTER — Encounter: Payer: Self-pay | Admitting: *Deleted

## 2018-12-03 LAB — NOVEL CORONAVIRUS, NAA (HOSP ORDER, SEND-OUT TO REF LAB; TAT 18-24 HRS): SARS-CoV-2, NAA: NOT DETECTED

## 2018-12-05 ENCOUNTER — Encounter: Payer: Self-pay | Admitting: *Deleted

## 2018-12-05 ENCOUNTER — Ambulatory Visit: Payer: Medicare Other | Admitting: Anesthesiology

## 2018-12-05 ENCOUNTER — Other Ambulatory Visit: Payer: Self-pay

## 2018-12-05 ENCOUNTER — Ambulatory Visit
Admission: RE | Admit: 2018-12-05 | Discharge: 2018-12-05 | Disposition: A | Discharge status: Home / Self Care | Payer: Medicare Other | Attending: Ophthalmology | Admitting: Ophthalmology

## 2018-12-05 ENCOUNTER — Encounter
Admission: RE | Disposition: A | Discharge status: Home / Self Care | Payer: Self-pay | Source: Home / Self Care | Attending: Ophthalmology

## 2018-12-05 DIAGNOSIS — H2512 Age-related nuclear cataract, left eye: Secondary | ICD-10-CM | POA: Diagnosis not present

## 2018-12-05 DIAGNOSIS — E119 Type 2 diabetes mellitus without complications: Secondary | ICD-10-CM | POA: Insufficient documentation

## 2018-12-05 DIAGNOSIS — Z8673 Personal history of transient ischemic attack (TIA), and cerebral infarction without residual deficits: Secondary | ICD-10-CM | POA: Diagnosis not present

## 2018-12-05 DIAGNOSIS — Z87891 Personal history of nicotine dependence: Secondary | ICD-10-CM | POA: Diagnosis not present

## 2018-12-05 DIAGNOSIS — H2511 Age-related nuclear cataract, right eye: Secondary | ICD-10-CM | POA: Diagnosis not present

## 2018-12-05 DIAGNOSIS — E1136 Type 2 diabetes mellitus with diabetic cataract: Secondary | ICD-10-CM | POA: Diagnosis not present

## 2018-12-05 HISTORY — PX: CATARACT EXTRACTION W/PHACO: SHX586

## 2018-12-05 HISTORY — DX: Transient cerebral ischemic attack, unspecified: G45.9

## 2018-12-05 LAB — GLUCOSE, CAPILLARY: Glucose-Capillary: 113 mg/dL — ABNORMAL HIGH (ref 70–99)

## 2018-12-05 SURGERY — PHACOEMULSIFICATION, CATARACT, WITH IOL INSERTION
Anesthesia: Monitor Anesthesia Care | Site: Eye | Laterality: Left

## 2018-12-05 MED ORDER — EPINEPHRINE PF 1 MG/ML IJ SOLN
INTRAOCULAR | Status: DC | PRN
  Administered 2018-12-05: 1 mL via OPHTHALMIC

## 2018-12-05 MED ORDER — DORZOLAMIDE HCL-TIMOLOL MAL 2-0.5 % OP SOLN
OPHTHALMIC | Status: AC
  Filled 2018-12-05: qty 10

## 2018-12-05 MED ORDER — FENTANYL CITRATE (PF) 100 MCG/2ML IJ SOLN
INTRAMUSCULAR | Status: DC | PRN
  Administered 2018-12-05: 50 ug via INTRAVENOUS

## 2018-12-05 MED ORDER — LIDOCAINE HCL (PF) 4 % IJ SOLN
INTRAOCULAR | Status: DC | PRN
  Administered 2018-12-05: 4 mL via OPHTHALMIC

## 2018-12-05 MED ORDER — TRYPAN BLUE 0.06 % OP SOLN
OPHTHALMIC | Status: AC
  Filled 2018-12-05: qty 0.5

## 2018-12-05 MED ORDER — TETRACAINE HCL 0.5 % OP SOLN
1.0000 [drp] | OPHTHALMIC | Status: AC
  Administered 2018-12-05 (×3): 1 [drp] via OPHTHALMIC

## 2018-12-05 MED ORDER — POVIDONE-IODINE 5 % OP SOLN
OPHTHALMIC | Status: AC
  Filled 2018-12-05: qty 60

## 2018-12-05 MED ORDER — NA CHONDROIT SULF-NA HYALURON 40-17 MG/ML IO SOLN
INTRAOCULAR | Status: DC | PRN
  Administered 2018-12-05: 1 mL via INTRAOCULAR

## 2018-12-05 MED ORDER — POVIDONE-IODINE 5 % OP SOLN
OPHTHALMIC | Status: DC | PRN
  Administered 2018-12-05 (×2): 1 via OPHTHALMIC

## 2018-12-05 MED ORDER — ARMC OPHTHALMIC DILATING DROPS
1.0000 "application " | OPHTHALMIC | Status: AC
  Administered 2018-12-05 (×3): 1 via OPHTHALMIC

## 2018-12-05 MED ORDER — LIDOCAINE HCL (PF) 4 % IJ SOLN
INTRAMUSCULAR | Status: AC
  Filled 2018-12-05: qty 5

## 2018-12-05 MED ORDER — MOXIFLOXACIN HCL 0.5 % OP SOLN: 1.0000 [drp] | OPHTHALMIC | Status: DC | PRN

## 2018-12-05 MED ORDER — MOXIFLOXACIN HCL 0.5 % OP SOLN
OPHTHALMIC | Status: DC | PRN
  Administered 2018-12-05: 0.2 mL via OPHTHALMIC

## 2018-12-05 MED ORDER — TRYPAN BLUE 0.06 % OP SOLN
OPHTHALMIC | Status: DC | PRN
  Administered 2018-12-05: 0.5 mL via INTRAOCULAR

## 2018-12-05 MED ORDER — EPINEPHRINE PF 1 MG/ML IJ SOLN
INTRAMUSCULAR | Status: AC
  Filled 2018-12-05: qty 2

## 2018-12-05 MED ORDER — TETRACAINE HCL 0.5 % OP SOLN
OPHTHALMIC | Status: AC
  Administered 2018-12-05: 07:00:00 1 [drp] via OPHTHALMIC
  Filled 2018-12-05: qty 4

## 2018-12-05 MED ORDER — MIDAZOLAM HCL 2 MG/2ML IJ SOLN
INTRAMUSCULAR | Status: DC | PRN
  Administered 2018-12-05: 0.5 mg via INTRAVENOUS
  Administered 2018-12-05: .5 mg via INTRAVENOUS

## 2018-12-05 MED ORDER — ACETAZOLAMIDE SODIUM 500 MG IJ SOLR
INTRAMUSCULAR | Status: AC
  Filled 2018-12-05: qty 500

## 2018-12-05 MED ORDER — SODIUM CHLORIDE 0.9 % IV SOLN
INTRAVENOUS | Status: DC
  Administered 2018-12-05: 07:00:00 via INTRAVENOUS

## 2018-12-05 MED ORDER — NA HYALUR & NA CHOND-NA HYALUR 0.55-0.5 ML IO KIT
PACK | INTRAOCULAR | Status: DC | PRN
  Administered 2018-12-05: 1 via OPHTHALMIC

## 2018-12-05 MED ORDER — ARMC OPHTHALMIC DILATING DROPS
OPHTHALMIC | Status: AC
  Administered 2018-12-05: 1 via OPHTHALMIC
  Filled 2018-12-05: qty 0.5

## 2018-12-05 MED ORDER — DORZOLAMIDE HCL-TIMOLOL MAL 2-0.5 % OP SOLN
OPHTHALMIC | Status: DC | PRN
  Administered 2018-12-05: 1 [drp] via OPHTHALMIC

## 2018-12-05 MED ORDER — MIDAZOLAM HCL 2 MG/2ML IJ SOLN
INTRAMUSCULAR | Status: AC
  Filled 2018-12-05: qty 2

## 2018-12-05 MED ORDER — FENTANYL CITRATE (PF) 100 MCG/2ML IJ SOLN
INTRAMUSCULAR | Status: AC
  Filled 2018-12-05: qty 2

## 2018-12-05 MED ORDER — MOXIFLOXACIN HCL 0.5 % OP SOLN
OPHTHALMIC | Status: AC
  Filled 2018-12-05: qty 3

## 2018-12-05 SURGICAL SUPPLY — 19 items
DISSECTOR HYDRO NUCLEUS 50X22 (MISCELLANEOUS) ×12 IMPLANT
DRSG TEGADERM 2-3/8X2-3/4 SM (GAUZE/BANDAGES/DRESSINGS) ×3 IMPLANT
GLOVE BIOGEL M 6.5 STRL (GLOVE) ×3 IMPLANT
GOWN STRL REUS W/ TWL LRG LVL3 (GOWN DISPOSABLE) ×1 IMPLANT
GOWN STRL REUS W/ TWL XL LVL3 (GOWN DISPOSABLE) ×1 IMPLANT
GOWN STRL REUS W/TWL LRG LVL3 (GOWN DISPOSABLE) ×2
GOWN STRL REUS W/TWL XL LVL3 (GOWN DISPOSABLE) ×2
KNIFE 45D UP 2.3 (MISCELLANEOUS) ×3 IMPLANT
LABEL CATARACT MEDS ST (LABEL) ×3 IMPLANT
LENS IOL ACRSF IQ ULTRA 23.0 (Intraocular Lens) ×1 IMPLANT
LENS IOL ACRYSOF IQ 23.0 (Intraocular Lens) ×3 IMPLANT
NEEDLE CAPSULORHEX 25GA (NEEDLE) ×3 IMPLANT
PACK CATARACT (MISCELLANEOUS) ×3 IMPLANT
PACK CATARACT KING (MISCELLANEOUS) ×3 IMPLANT
PACK EYE AFTER SURG (MISCELLANEOUS) ×3 IMPLANT
SOL BSS BAG (MISCELLANEOUS) ×3
SOLUTION BSS BAG (MISCELLANEOUS) ×1 IMPLANT
WATER STERILE IRR 250ML POUR (IV SOLUTION) ×3 IMPLANT
WIPE NON LINTING 3.25X3.25 (MISCELLANEOUS) ×3 IMPLANT

## 2018-12-05 NOTE — Transfer of Care (Signed)
Immediate Anesthesia Transfer of Care Note  Patient: Eric Morris  Procedure(s) Performed: CATARACT EXTRACTION PHACO AND INTRAOCULAR LENS PLACEMENT (IOC) LEFT, DIABETIC (Left Eye)  Patient Location: Short Stay  Anesthesia Type:MAC  Level of Consciousness: awake, alert  and oriented  Airway & Oxygen Therapy: Patient Spontanous Breathing  Post-op Assessment: Report given to RN  Post vital signs: Reviewed and stable  Last Vitals:  Vitals Value Taken Time  BP    Temp    Pulse    Resp    SpO2      Last Pain:  Vitals:   12/05/18 0629  TempSrc: Tympanic  PainSc: 0-No pain         Complications: No apparent anesthesia complications

## 2018-12-05 NOTE — Anesthesia Preprocedure Evaluation (Addendum)
Anesthesia Evaluation  Patient identified by MRN, date of birth, ID band Patient awake    Reviewed: Allergy & Precautions, H&P , NPO status , reviewed documented beta blocker date and time   Airway Mallampati: II  TM Distance: >3 FB Neck ROM: full    Dental  (+) Upper Dentures, Lower Dentures   Pulmonary former smoker,    Pulmonary exam normal        Cardiovascular Normal cardiovascular exam  Normal ECHO 2017   Neuro/Psych TIACVA, No Residual Symptoms    GI/Hepatic neg GERD  ,  Endo/Other  diabetes  Renal/GU      Musculoskeletal  (+) Arthritis ,   Abdominal   Peds  Hematology   Anesthesia Other Findings Past Medical History: No date: Arthritis No date: Diabetes mellitus without complication ([place]) No date: Pain     Comment:  RIGHT SHOULDER  04/19/2018: Retinopathy     Comment:  No diabetic Retinopathy No date: Stroke [facility])     Comment:  TIA 2017 No date: TIA (transient ischemic attack)  Past Surgical History: No date: BACK SURGERY 08/22/2018: CATARACT EXTRACTION W/PHACO; Right     Comment:  Procedure: CATARACT EXTRACTION PHACO AND INTRAOCULAR               LENS PLACEMENT (IOC)-RIGHT;  Surgeon: Marchia Meiers, MD;               Location: [facility] ORS;  Service: Ophthalmology;  Laterality:              Right;  [place] 02:18.0 CDE 24.65 Fluid Pack Lot # [id number] No date: HERNIA REPAIR  BMI    Body Mass Index: 20.51 kg/m      Reproductive/Obstetrics                            Anesthesia Physical Anesthesia Plan  ASA: II  Anesthesia Plan: MAC   Post-op Pain Management:    Induction: Intravenous  PONV Risk Score and Plan: Treatment may vary due to age or medical condition and TIVA  Airway Management Planned: Nasal Cannula and Natural Airway  Additional Equipment:   Intra-op Plan:   Post-operative Plan:   Informed Consent: I have reviewed the patients History and Physical,  chart, labs and discussed the procedure including the risks, benefits and alternatives for the proposed anesthesia with the patient or authorized representative who has indicated his/her understanding and acceptance.     Dental Advisory Given  Plan Discussed with: CRNA  Anesthesia Plan Comments:         Anesthesia Quick Evaluation

## 2018-12-05 NOTE — H&P (Signed)
   I have reviewed the patient's H&P and agree with its findings. There have been no interval changes.  Gweneth Fredlund MD Ophthalmology 

## 2018-12-05 NOTE — Op Note (Signed)
  PREOPERATIVE DIAGNOSIS:  Nuclear sclerotic cataract of the LEFT eye.   POSTOPERATIVE DIAGNOSIS:  Nuclear sclerotic cataract of the LEFT eye.   OPERATIVE PROCEDURE: Cataract surgery OS   SURGEON:  Marchia Meiers, MD.   ANESTHESIA:  Anesthesiologist: Alphonsus Sias, MD CRNA: Philbert Riser, CRNA  1.      Managed anesthesia care. 2.     0.48ml of Shugarcaine was instilled following the paracentesis   COMPLICATIONS:  None.   TECHNIQUE:   Divide and conquer   DESCRIPTION OF PROCEDURE:  The patient was examined and consented in the preoperative holding area where the aforementioned topical anesthesia was applied to the LEFT eye and then brought back to the Operating Room where the left eye was prepped and draped in the usual sterile ophthalmic fashion and a lid speculum was placed. A paracentesis was created with the side port blade, the anterior chamber was washed out with trypan blue to stain the anterior capsule, and the anterior chamber was filled with viscoelastic. A near clear corneal incision was performed with the steel keratome. A continuous curvilinear capsulorrhexis was performed with a cystotome followed by the capsulorrhexis forceps. Hydrodissection and hydrodelineation were carried out with BSS on a blunt cannula. The lens was removed in a divide and conquer  technique and the remaining cortical material was removed with the irrigation-aspiration handpiece. The capsular bag was inflated with viscoelastic and the lens was placed in the capsular bag without complication. The remaining viscoelastic was removed from the eye with the irrigation-aspiration handpiece. The wounds were hydrated. The anterior chamber was flushed and the eye was inflated to physiologic pressure. 0.71ml Vigamox was placed in the anterior chamber. The wounds were found to be water tight. The eye was dressed with Vigamox. The patient was given protective glasses to wear throughout the day and a shield with which to  sleep tonight. The patient was also given drops with which to begin a drop regimen today and will follow-up with me in one day. Implant Name Type Inv. Item Serial No. Manufacturer Lot No. LRB No. Used Action  LENS IOL ACRYSOF IQ 23.0 - [id number] Intraocular Lens LENS IOL ACRYSOF IQ 23.0 [id number] [employer]CON  Left 1 Implanted    Procedure(s) with comments: CATARACT EXTRACTION PHACO AND INTRAOCULAR LENS PLACEMENT (IOC) LEFT, DIABETIC (Left) - [place] 00:55.8 AP% 12.2 CDE 8.10 Fluid Pack Lot # [id number]  Electronically signed: Marchia Meiers 12/05/2018 8:29 AM

## 2018-12-05 NOTE — Anesthesia Postprocedure Evaluation (Signed)
Anesthesia Post Note  Patient: Eric Morris  Procedure(s) Performed: CATARACT EXTRACTION PHACO AND INTRAOCULAR LENS PLACEMENT (IOC) LEFT, DIABETIC (Left Eye)  Patient location during evaluation: Phase II Anesthesia Type: MAC Level of consciousness: awake and alert Pain management: pain level controlled Vital Signs Assessment: post-procedure vital signs reviewed and stable Respiratory status: spontaneous breathing, nonlabored ventilation and respiratory function stable Cardiovascular status: blood pressure returned to baseline and stable Postop Assessment: no apparent nausea or vomiting Anesthetic complications: no     Last Vitals:  Vitals:   12/05/18 0629 12/05/18 0830  BP: (!) 167/72 (!) 147/66  Pulse: (!) 51 (!) 47  Resp: 16 16  Temp: (!) 36.2 C 36.6 C  SpO2: 100% 100%    Last Pain:  Vitals:   12/05/18 0830  TempSrc: Temporal  PainSc: 0-No pain                 Alphonsus Sias

## 2018-12-05 NOTE — Anesthesia Post-op Follow-up Note (Signed)
Anesthesia QCDR form completed.        

## 2018-12-05 NOTE — Discharge Instructions (Addendum)
Eye Surgery Discharge Instructions  Expect mild scratchy sensation or mild soreness. DO NOT RUB YOUR EYE!  The day of surgery:  Minimal physical activity, but bed rest is not required  No reading, computer work, or close hand work  No bending, lifting, or straining.  May watch TV  For 24 hours:  No driving, legal decisions, or alcoholic beverages  Safety precautions  Eat anything you prefer: It is better to start with liquids, then soup then solid foods. *     Solar shield eyeglasses should be worn for comfort in the sunlight/patch while sleeping  Resume all regular medications including aspirin or Coumadin if these were discontinued prior to surgery. You may shower, bathe, shave, or wash your hair. Tylenol may be taken for mild discomfort. Follow Dr. Sharene Butters eye drop instruction sheet as reviewed.  Call your doctor if you experience significant pain, nausea, or vomiting, fever > 101 or other signs of infection. [phone] or [phone] Specific instructions:  Follow-up Information    Marchia Meiers, MD Follow up.   Specialty: Ophthalmology Why: as scheduled Contact information: [address] [place] [address] [phone]

## 2019-03-03 ENCOUNTER — Ambulatory Visit (INDEPENDENT_AMBULATORY_CARE_PROVIDER_SITE_OTHER): Payer: Medicare Other

## 2019-03-03 ENCOUNTER — Other Ambulatory Visit: Payer: Self-pay

## 2019-03-03 DIAGNOSIS — Z0000 Encounter for general adult medical examination without abnormal findings: Secondary | ICD-10-CM | POA: Diagnosis not present

## 2019-03-03 NOTE — Patient Instructions (Addendum)
  Eric Morris , Thank you for taking time to come for your Medicare Wellness Visit. I appreciate your ongoing commitment to your health goals. Please review the following plan we discussed and let me know if I can assist you in the future.   These are the goals we discussed: Goals    . Follow up with Primary Care Provider     As needed       This is a list of the screening recommended for you and due dates:  Health Maintenance  Topic Date Due  . Eye exam for diabetics  12/21/1950  . Tetanus Vaccine  03/08/2019*  . Pneumonia vaccines (2 of 2 - PCV13) 03/08/2019*  . Flu Shot  09/17/2019*  . Complete foot exam   03/08/2019  . Hemoglobin A1C  03/12/2019  . Urine Protein Check  09/09/2019  *Topic was postponed. The date shown is not the original due date.

## 2019-03-03 NOTE — Progress Notes (Signed)
Subjective:   Eric Morris is a 78 y.o. male who presents for an Initial Medicare Annual Wellness Visit.  Review of Systems  No ROS.  Medicare Wellness Virtual Visit.  Visual/audio telehealth visit, UTA vital signs.   See social history for additional risk factors.   Cardiac Risk Factors include: advanced age (>32men, >33 women);diabetes mellitus;male gender    Objective:    Today's Vitals   There is no height or weight on file to calculate BMI.  Advanced Directives 03/03/2019 12/05/2018 02/07/2018 04/12/2016  Does Patient Have a Medical Advance Directive? No No No No  Would patient like information on creating a medical advance directive? No - Patient declined No - Patient declined No - Patient declined No - patient declined information    Current Medications (verified) Outpatient Encounter Medications as of 03/03/2019  Medication Sig  . aspirin EC 325 MG EC tablet Take 1 tablet (325 mg total) by mouth daily.  . metFORMIN (GLUCOPHAGE) 500 MG tablet Take 1 tablet (500 mg total) by mouth 2 (two) times daily with a meal.   No facility-administered encounter medications on file as of 03/03/2019.     Allergies (verified) Patient has no known allergies.   History: Past Medical History:  Diagnosis Date  . Arthritis   . Diabetes mellitus without complication ([place])   . Pain    RIGHT SHOULDER   . Retinopathy 04/19/2018   No diabetic Retinopathy  . Stroke [facility])    TIA 2017  . TIA (transient ischemic attack)    Past Surgical History:  Procedure Laterality Date  . BACK SURGERY    . CATARACT EXTRACTION W/PHACO Right 08/22/2018   Procedure: CATARACT EXTRACTION PHACO AND INTRAOCULAR LENS PLACEMENT (IOC)-RIGHT;  Surgeon: Marchia Meiers, MD;  Location: [facility] ORS;  Service: Ophthalmology;  Laterality: Right;  [place] 02:18.0 CDE 24.65 Fluid Pack Lot # [id number]  . CATARACT EXTRACTION W/PHACO Left 12/05/2018   Procedure: CATARACT EXTRACTION PHACO AND INTRAOCULAR LENS PLACEMENT (IOC) LEFT,  DIABETIC;  Surgeon: Marchia Meiers, MD;  Location: [facility] ORS;  Service: Ophthalmology;  Laterality: Left;  [place] 00:55.8 AP% 12.2 CDE 8.10 Fluid Pack Lot # [id number]  . HERNIA REPAIR     History reviewed. No pertinent family history. Social History   Socioeconomic History  . Marital status: Married    Spouse name: Not on file  . Number of children: Not on file  . Years of education: Not on file  . Highest education level: Not on file  Occupational History  . Not on file  Social Needs  . Financial resource strain: Not hard at all  . Food insecurity    Worry: Never true    Inability: Never true  . Transportation needs    Medical: No    Non-medical: No  Tobacco Use  . Smoking status: Former Smoker    Packs/day: 1.50    Years: 15.00    Pack years: 22.50    Types: Cigarettes    Quit date: 06/19/2005    Years since quitting: 13.7  . Smokeless tobacco: Current User    Types: Chew  Substance and Sexual Activity  . Alcohol use: Yes    Alcohol/week: 4.0 - 5.0 standard drinks    Types: 4 - 5 Cans of beer per week    Comment: OCCAS  . Drug use: Never  . Sexual activity: Not on file  Lifestyle  . Physical activity    Days per week: 4 days    Minutes per session: 20 min  .  Stress: Not at all  Relationships  . Social Herbalist on phone: Not on file    Gets together: Not on file    Attends religious service: Not on file    Active member of club or organization: Not on file    Attends meetings of clubs or organizations: Not on file    Relationship status: Married  Other Topics Concern  . Not on file  Social History Narrative  . Not on file   Tobacco Counseling Ready to quit: Not Answered Counseling given: Not Answered   Clinical Intake:  Pre-visit preparation completed: Yes        Diabetes: Yes(Followed by Endocrinologist, Dr. Shirlee Latch)  How often do you need to have someone help you when you read instructions, pamphlets, or other written materials from your  doctor or pharmacy?: 1 - Never  Interpreter Needed?: No     Activities of Daily Living In your present state of health, do you have any difficulty performing the following activities: 03/03/2019  Hearing? N  Vision? N  Difficulty concentrating or making decisions? N  Walking or climbing stairs? N  Dressing or bathing? N  Doing errands, shopping? N  Preparing Food and eating ? N  Using the Toilet? N  In the past six months, have you accidently leaked urine? N  Do you have problems with loss of bowel control? N  Managing your Medications? N  Managing your Finances? N  Housekeeping or managing your Housekeeping? N  Some recent data might be hidden     Immunizations and Health Maintenance Immunization History  Administered Date(s) Administered  . Influenza, High Dose Seasonal PF 03/07/2018  . Influenza,inj,Quad PF,6+ Mos 04/14/2016  . Pneumococcal Polysaccharide-23 04/14/2016   Health Maintenance Due  Topic Date Due  . OPHTHALMOLOGY EXAM  12/21/1950    Patient Care Team: Jodelle Green, FNP as PCP - General (Family Medicine)  Indicate any recent Medical Services you may have received from other than [facility] providers in the past year (date may be approximate).    Assessment:   This is a routine wellness examination for Eric Morris.  I connected with patient 03/03/19 at  1:00 PM EDT by an audio enabled telemedicine application and verified that I am speaking with the correct person using two identifiers. Patient stated full name and DOB. Patient gave permission to continue with virtual visit. Patient's location was at home and Nurse's location was at [place] office.   Health Maintenance Due: -Influenza vaccine 2020- discussed; to be completed in season with doctor or local pharmacy.   -A1C- 09/09/18 (6.6) -Update all pending maintenance due as appropriate.   See completed HM at the end of note.   Eye: Visual acuity not assessed. Virtual visit. Wears corrective lenses. Followed by  their ophthalmologist every 12 months. Cataracts extracted, bilateral.  Retinopathy- none reported.  Dental: Dentures- yes  Hearing: Demonstrates normal hearing during visit.  Safety:  Patient feels safe at home- yes Patient does have smoke detectors at home- yes Patient does wear sunscreen or protective clothing when in direct sunlight - yes Patient does wear seat belt when in a moving vehicle - yes Patient drives- yes Adequate lighting in walkways free from debris- yes Grab bars and handrails used as appropriate- yes Ambulates with no assistive device. Cell phone on person when ambulating outside of the home- no  Social: Alcohol intake - yes      Smokeless tobacco- current Tobacco history- former smoker Illicit drug use- none  Depression: PHQ 2 &9 complete. See screening below. Denies irritability, anhedonia, sadness/tearfullness.  Stable.   Falls: See screening below.    Medication: Taking as directed and without issues.   Covid-19: Precautions and sickness symptoms discussed. Wears mask, social distancing, hand hygiene as appropriate.   Activities of Daily Living Patient denies needing assistance with: household chores, feeding themselves, getting from bed to chair, getting to the toilet, bathing/showering, dressing, managing money, or preparing meals.   Memory: Patient is alert. Patient denies difficulty focusing or concentrating. Correctly identified the president of the [place], season and recall.  BMI- discussed the importance of a healthy diet, water intake and the benefits of aerobic exercise.  Educational material provided.  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 No routine.  Diet:  Regular Water: good intake Caffeine: 1 cup 3-4 days days weekly  Advanced Directive: End of life planning; Advanced aging; Advanced directives discussed.  No HCPOA/Living Will.  Additional information declined at this time.  Other Providers Patient  Care Team: Jodelle Green, FNP as PCP - General (Family Medicine)  Hearing/Vision screen  Hearing Screening   125Hz  250Hz  500Hz  1000Hz  2000Hz  3000Hz  4000Hz  6000Hz  8000Hz   Right ear:           Left ear:           Comments: Patient is able to hear conversational tones without difficulty.  No issues reported.  Vision Screening Comments: Cataract extraction, bilateral Visual acuity not assessed, virtual visit.      Dietary issues and exercise activities discussed: Current Exercise Habits: Home exercise routine, Type of exercise: walking, Time (Minutes): 20, Frequency (Times/Week): 4, Weekly Exercise (Minutes/Week): 80, Intensity: Mild  Goals    . Follow up with Primary Care Provider     As needed      Depression Screen PHQ 2/9 Scores 03/03/2019 03/07/2018  PHQ - 2 Score 0 0    Fall Risk Fall Risk  03/03/2019 03/07/2018  Falls in the past year? 0 Yes  Number falls in past yr: - 1  Injury with Fall? - No   Timed Get Up and Go performed: no, virtual visit  Cognitive Function:     6CIT Screen 03/03/2019  What Year? 0 points  What month? 0 points  What time? 0 points  Count back from 20 0 points  Months in reverse 0 points  Repeat phrase 0 points  Total Score 0    Screening Tests Health Maintenance  Topic Date Due  . OPHTHALMOLOGY EXAM  12/21/1950  . TETANUS/TDAP  03/08/2019 (Originally 12/20/1960)  . PNA vac Low Risk Adult (2 of 2 - PCV13) 03/08/2019 (Originally 04/14/2017)  . INFLUENZA VACCINE  09/17/2019 (Originally 01/18/2019)  . FOOT EXAM  03/08/2019  . HEMOGLOBIN A1C  03/12/2019  . URINE MICROALBUMIN  09/09/2019      Plan:   Keep all routine maintenance appointments.   Follow up 03/13/19 @ 1:20.  Medicare Attestation I have personally reviewed: The patient's medical and social history Their use of alcohol, tobacco or illicit drugs Their current medications and supplements The patient's functional ability including ADLs,fall risks, home safety risks,  cognitive, and hearing and visual impairment Diet and physical activities Evidence for depression   In addition, I have reviewed and discussed with patient certain preventive protocols, quality metrics, and best practice recommendations. A written personalized care plan for preventive services as well as general preventive health recommendations were provided to patient via mail.     OBrien-Blaney, Artez Regis L, LPN  03/03/2019       

## 2019-03-11 ENCOUNTER — Other Ambulatory Visit: Payer: Self-pay

## 2019-03-13 ENCOUNTER — Ambulatory Visit (INDEPENDENT_AMBULATORY_CARE_PROVIDER_SITE_OTHER): Payer: Medicare Other | Admitting: Family Medicine

## 2019-03-13 ENCOUNTER — Other Ambulatory Visit: Payer: Self-pay

## 2019-03-13 ENCOUNTER — Encounter: Payer: Self-pay | Admitting: Family Medicine

## 2019-03-13 VITALS — BP 136/86 | HR 58 | Temp 98.2°F | Wt 140.2 lb

## 2019-03-13 DIAGNOSIS — Z23 Encounter for immunization: Secondary | ICD-10-CM | POA: Diagnosis not present

## 2019-03-13 DIAGNOSIS — E119 Type 2 diabetes mellitus without complications: Secondary | ICD-10-CM

## 2019-03-13 LAB — CBC WITH DIFFERENTIAL/PLATELET
Basophils Absolute: 0.1 10*3/uL (ref 0.0–0.1)
Basophils Relative: 1.1 % (ref 0.0–3.0)
Eosinophils Absolute: 0.1 10*3/uL (ref 0.0–0.7)
Eosinophils Relative: 1.8 % (ref 0.0–5.0)
HCT: 40 % (ref 39.0–52.0)
Hemoglobin: 13.3 g/dL (ref 13.0–17.0)
Lymphocytes Relative: 28.9 % (ref 12.0–46.0)
Lymphs Abs: 1.8 10*3/uL (ref 0.7–4.0)
MCHC: 33.3 g/dL (ref 30.0–36.0)
MCV: 95.8 fl (ref 78.0–100.0)
Monocytes Absolute: 0.5 10*3/uL (ref 0.1–1.0)
Monocytes Relative: 7.6 % (ref 3.0–12.0)
Neutro Abs: 3.7 10*3/uL (ref 1.4–7.7)
Neutrophils Relative %: 60.6 % (ref 43.0–77.0)
Platelets: 285 10*3/uL (ref 150.0–400.0)
RBC: 4.17 Mil/uL — ABNORMAL LOW (ref 4.22–5.81)
RDW: 13.8 % (ref 11.5–15.5)
WBC: 6.1 10*3/uL (ref 4.0–10.5)

## 2019-03-13 LAB — COMPREHENSIVE METABOLIC PANEL
ALT: 7 U/L (ref 0–53)
AST: 12 U/L (ref 0–37)
Albumin: 4 g/dL (ref 3.5–5.2)
Alkaline Phosphatase: 55 U/L (ref 39–117)
BUN: 20 mg/dL (ref 6–23)
CO2: 30 mEq/L (ref 19–32)
Calcium: 9.4 mg/dL (ref 8.4–10.5)
Chloride: 106 mEq/L (ref 96–112)
Creatinine, Ser: 1.18 mg/dL (ref 0.40–1.50)
GFR: 59.82 mL/min — ABNORMAL LOW (ref >60.00)
Glucose, Bld: 105 mg/dL — ABNORMAL HIGH (ref 70–99)
Potassium: 4 mEq/L (ref 3.5–5.1)
Sodium: 143 mEq/L (ref 135–145)
Total Bilirubin: 0.3 mg/dL (ref 0.2–1.2)
Total Protein: 6.7 g/dL (ref 6.0–8.3)

## 2019-03-13 LAB — HEMOGLOBIN A1C: Hgb A1c MFr Bld: 6.5 % (ref 4.6–6.5)

## 2019-03-13 NOTE — Progress Notes (Signed)
Subjective:    Patient ID: Eric Morris, male    DOB: 1940/08/25, 78 y.o.   MRN: [id number]  HPI   Patient presents to clinic for follow-up on diabetes.  Overall he is feeling well.  He does not regularly check his blood sugar, usually goes on how he feels.  Tolerates the Metformin without any issues.  Also takes aspirin daily due to history of CVA/TIAs.  He is agreeable to flu vaccine today  He has good support from his son and family members.  He has been spending a lot of his time at home throughout the pandemic, and when does go out to the store always wears this mask.  Patient Active Problem List   Diagnosis Date Noted  . Needs flu shot 03/07/2018  . Diabetes mellitus without complication ([place]) [id number]  . CVA (cerebral vascular accident) ([place]) 04/13/2016  . TIA (transient ischemic attack) 04/12/2016   Social History   Tobacco Use  . Smoking status: Former Smoker    Packs/day: 1.50    Years: 15.00    Pack years: 22.50    Types: Cigarettes    Quit date: 06/19/2005    Years since quitting: 13.7  . Smokeless tobacco: Current User    Types: Chew  Substance Use Topics  . Alcohol use: Yes    Alcohol/week: 4.0 - 5.0 standard drinks    Types: 4 - 5 Cans of beer per week    Comment: OCCAS   Review of Systems  Constitutional: Negative for chills, fatigue and fever.  HENT: Negative for congestion, ear pain, sinus pain and sore throat.   Eyes: Negative.   Respiratory: Negative for cough, shortness of breath and wheezing.   Cardiovascular: Negative for chest pain, palpitations and leg swelling.  Gastrointestinal: Negative for abdominal pain, diarrhea, nausea and vomiting.  Genitourinary: Negative for dysuria, frequency and urgency.  Musculoskeletal: Negative for arthralgias and myalgias.  Skin: Negative for color change, pallor and rash.  Neurological: Negative for syncope, light-headedness and headaches.  Psychiatric/Behavioral: The patient is not nervous/anxious.      Objective:   Physical Exam Vitals signs and nursing note reviewed.  Constitutional:      General: He is not in acute distress.    Appearance: He is not ill-appearing, toxic-appearing or diaphoretic.  HENT:     Head: Normocephalic.  Eyes:     General: No scleral icterus.    Extraocular Movements: Extraocular movements intact.     Pupils: Pupils are equal, round, and reactive to light.  Neck:     Musculoskeletal: Normal range of motion and neck supple. No neck rigidity.     Vascular: No carotid bruit.  Cardiovascular:     Rate and Rhythm: Normal rate and regular rhythm.     Heart sounds: Normal heart sounds.  Pulmonary:     Effort: Pulmonary effort is normal. No respiratory distress.     Breath sounds: Normal breath sounds.  Musculoskeletal:     Right lower leg: No edema.     Left lower leg: No edema.  Skin:    General: Skin is warm and dry.     Coloration: Skin is not jaundiced or pale.  Neurological:     Mental Status: He is alert and oriented to person, place, and time.     Gait: Gait normal.  Psychiatric:        Mood and Affect: Mood normal.        Behavior: Behavior normal.  Thought Content: Thought content normal.        Judgment: Judgment normal.     Today's Vitals   03/13/19 1309 03/13/19 1323  BP: (!) 158/80 136/86  Pulse: (!) 58   Temp: 98.2 F (36.8 C)   SpO2: 99%   Weight: 140 lb 3.2 oz (63.6 kg)    Body mass index is 19.83 kg/m.     Assessment & Plan:   Type 2 diabetes-we will get new blood work to follow-up on A1c.  He will continue metformin at low dose.  He will continue to follow healthy diet, encouraged good water intake and being sure to eat a variety of foods.  Flu vaccine given in clinic today  Patient will follow-up in about 6 months for regular checkup.  Aware he can return to clinic sooner or call at anytime with questions or concerns

## 2019-04-23 ENCOUNTER — Ambulatory Visit: Payer: Self-pay | Admitting: *Deleted

## 2019-04-23 NOTE — Telephone Encounter (Signed)
Summary: Supplements ?    Wanting to know if her husband can take some vitamins for seniors / bottle said to call your dr [phone] Eric Morris, husband will be there      Call to wife- she is wanting to know if patient can take multivitamin. Patient has some fatigue and they think it may help. Will send question to PCP. Reason for Disposition . Caller wants to use a complementary or alternative medicine  Answer Assessment - Initial Assessment Questions 1.   NAME of MEDICATION: "What medicine are you calling about?"     Centrum silver 2.   QUESTION: "What is your question?"     Is it safe for patient to take 3.   PRESCRIBING HCP: "Who prescribed it?" Reason: if prescribed by specialist, call should be referred to that group.     OTC 4. SYMPTOMS: "Do you have any symptoms?"     fatigue 5. SEVERITY: If symptoms are present, ask "Are they mild, moderate or severe?"     moderate 6.  PREGNANCY:  "Is there any chance that you are pregnant?" "When was your last menstrual period?"     n/a  Protocols used: MEDICATION QUESTION CALL-A-AH

## 2019-04-24 NOTE — Telephone Encounter (Signed)
Yes a general multi vitamin for seniors would be fine to add on

## 2019-04-24 NOTE — Telephone Encounter (Signed)
I called & notified patient's wife that it was okay to take a multivitamin. Eric Morris

## 2019-06-23 ENCOUNTER — Telehealth: Payer: Self-pay | Admitting: Family Medicine

## 2019-06-23 DIAGNOSIS — E119 Type 2 diabetes mellitus without complications: Secondary | ICD-10-CM

## 2019-06-23 NOTE — Telephone Encounter (Signed)
Pt needs a refill on metFORMIN (GLUCOPHAGE) 500 MG tablet  To [facility]  Pt is out

## 2019-06-24 MED ORDER — METFORMIN HCL 500 MG PO TABS: 500.0000 mg | ORAL_TABLET | Freq: Two times a day (BID) | ORAL | 3 refills | Status: DC

## 2019-07-09 ENCOUNTER — Telehealth: Payer: Self-pay | Admitting: Internal Medicine

## 2019-07-09 NOTE — Telephone Encounter (Signed)
I called pt twice and left vm to call ofc. °

## 2019-07-11 ENCOUNTER — Other Ambulatory Visit: Payer: Self-pay

## 2019-07-11 ENCOUNTER — Encounter: Payer: Self-pay | Admitting: Internal Medicine

## 2019-07-11 ENCOUNTER — Ambulatory Visit (INDEPENDENT_AMBULATORY_CARE_PROVIDER_SITE_OTHER): Payer: Medicare Other | Admitting: Internal Medicine

## 2019-07-11 VITALS — BP 132/70 | HR 64 | Temp 97.7°F | Ht 70.5 in | Wt 151.0 lb

## 2019-07-11 DIAGNOSIS — Z1283 Encounter for screening for malignant neoplasm of skin: Secondary | ICD-10-CM

## 2019-07-11 DIAGNOSIS — L84 Corns and callosities: Secondary | ICD-10-CM | POA: Diagnosis not present

## 2019-07-11 DIAGNOSIS — E559 Vitamin D deficiency, unspecified: Secondary | ICD-10-CM

## 2019-07-11 DIAGNOSIS — Z125 Encounter for screening for malignant neoplasm of prostate: Secondary | ICD-10-CM

## 2019-07-11 DIAGNOSIS — I6529 Occlusion and stenosis of unspecified carotid artery: Secondary | ICD-10-CM | POA: Insufficient documentation

## 2019-07-11 DIAGNOSIS — I6523 Occlusion and stenosis of bilateral carotid arteries: Secondary | ICD-10-CM

## 2019-07-11 DIAGNOSIS — E119 Type 2 diabetes mellitus without complications: Secondary | ICD-10-CM

## 2019-07-11 DIAGNOSIS — Z1329 Encounter for screening for other suspected endocrine disorder: Secondary | ICD-10-CM

## 2019-07-11 DIAGNOSIS — M1990 Unspecified osteoarthritis, unspecified site: Secondary | ICD-10-CM | POA: Insufficient documentation

## 2019-07-11 NOTE — Progress Notes (Signed)
Chief Complaint  Patient presents with  . Transitions Of Care   F/u TOC 1. DM 2 A1C 6.5 02/2019 on metformin 500 mg bid He is doing well and denies complaints    Review of Systems  Constitutional: Negative for weight loss.  HENT: Positive for hearing loss.   Eyes: Negative for blurred vision.  Respiratory: Negative for shortness of breath.   Cardiovascular: Negative for chest pain.  Gastrointestinal: Negative for abdominal pain.  Musculoskeletal: Negative for falls.  Skin: Negative for rash.  Neurological: Negative for headaches.  Psychiatric/Behavioral: Negative for depression.   Past Medical History:  Diagnosis Date  . Arthritis   . Diabetes mellitus without complication ([place])   . Pain    RIGHT SHOULDER   . Retinopathy 04/19/2018   No diabetic Retinopathy  . Stroke [facility])    TIA 2017  . TIA (transient ischemic attack)    Past Surgical History:  Procedure Laterality Date  . BACK SURGERY    . CATARACT EXTRACTION W/PHACO Right 08/22/2018   Procedure: CATARACT EXTRACTION PHACO AND INTRAOCULAR LENS PLACEMENT (IOC)-RIGHT;  Surgeon: Marchia Meiers, MD;  Location: [facility] ORS;  Service: Ophthalmology;  Laterality: Right;  [place] 02:18.0CDE 24.65Fluid Pack Lot # [id number]  . CATARACT EXTRACTION W/PHACO Left 12/05/2018   Procedure: CATARACT EXTRACTION PHACO AND INTRAOCULAR LENS PLACEMENT (IOC) LEFT, DIABETIC;  Surgeon: Marchia Meiers, MD;  Location: [facility] ORS;  Service: Ophthalmology;  Laterality: Left;  [place] 00:55.8 AP% 12.2 CDE 8.10 Fluid Pack Lot # [id number]  . HERNIA REPAIR     Family History  Problem Relation Age of Onset  . Cancer Mother        ?type  . Cancer Father        ?type  . Cancer Brother        ?GB cancer    Social History   Socioeconomic History  . Marital status: Married    Spouse name: Not on file  . Number of children: Not on file  . Years of education: Not on file  . Highest education level: Not on file  Occupational History  . Not on file  Tobacco Use  .  Smoking status: Former Smoker    Packs/day: 1.50    Years: 15.00    Pack years: 22.50    Types: Cigarettes    Quit date: 06/19/2005    Years since quitting: 14.0  . Smokeless tobacco: Current User    Types: Chew  Substance and Sexual Activity  . Alcohol use: Yes    Alcohol/week: 4.0 - 5.0 standard drinks    Types: 4 - 5 Cans of beer per week    Comment: OCCAS  . Drug use: Never  . Sexual activity: Not Currently    Partners: Female  Other Topics Concern  . 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 Difficulty of Paying Living Expenses: Not hard at all  Food Insecurity: No Food Insecurity  . Worried About Charity fundraiser in the Last Year: Never true  . Ran Out of Food in the Last Year: Never  true  Transportation Needs: No Transportation Needs  . Lack of Transportation (Medical): No  . Lack of Transportation (Non-Medical): No  Physical Activity: Insufficiently Active  . Days of Exercise per Week: 4 days  . Minutes of Exercise per Session: 20 min  Stress: No Stress Concern Present  . Feeling of Stress : Not at all  Social Connections: Unknown  . Frequency of Communication with Friends and Family: Not on file  . Frequency of Social Gatherings with Friends and Family: Not on file  . Attends Religious Services: Not on file  . Active Member of Clubs or Organizations: Not on file  . Attends Archivist Meetings: Not on file  . Marital Status: Married  Human resources officer Violence: Not At Risk  . Fear of Current or Ex-Partner: No  . Emotionally Abused: No  . Physically Abused: No  . Sexually Abused: No   Current Meds  Medication Sig  . aspirin EC 325 MG EC tablet Take 1  tablet (325 mg total) by mouth daily.  . metFORMIN (GLUCOPHAGE) 500 MG tablet Take 1 tablet (500 mg total) by mouth 2 (two) times daily with a meal.   No Known Allergies No results found for this or any previous visit (from the past 2160 hour(s)). Objective  Body mass index is 21.36 kg/m. Wt Readings from Last 3 Encounters:  07/11/19 151 lb (68.5 kg)  03/13/19 140 lb 3.2 oz (63.6 kg)  12/05/18 145 lb (65.8 kg)   Temp Readings from Last 3 Encounters:  07/11/19 97.7 F (36.5 C) (Skin)  03/13/19 98.2 F (36.8 C)  12/05/18 97.8 F (36.6 C) (Temporal)   BP Readings from Last 3 Encounters:  07/11/19 132/70  03/13/19 136/86  12/05/18 (!) 147/66   Pulse Readings from Last 3 Encounters:  07/11/19 64  03/13/19 (!) 58  12/05/18 (!) 47    Physical Exam Vitals and nursing note reviewed.  Constitutional:      Appearance: Normal appearance. He is well-developed and well-groomed.  HENT:     Head: Normocephalic and atraumatic.  Eyes:     Conjunctiva/sclera: Conjunctivae normal.     Pupils: Pupils are equal, round, and reactive to light.  Cardiovascular:     Rate and Rhythm: Normal rate and regular rhythm.     Pulses:          Dorsalis pedis pulses are 1+ on the right side and 1+ on the left side.       Posterior tibial pulses are 1+ on the right side and 1+ on the left side.     Heart sounds: Normal heart sounds. No murmur.  Pulmonary:     Effort: Pulmonary effort is normal.     Breath sounds: Normal breath sounds.  Abdominal:     General: Abdomen is flat. Bowel sounds are normal.     Tenderness: There is no abdominal tenderness.  Musculoskeletal:     Right lower leg: No edema.     Left lower leg: No edema.  Skin:    General: Skin is warm and dry.     Comments: Sks  Aks left arm and ?nodular bcc left forearm    Neurological:     General: No focal deficit present.     Mental Status: He is alert and oriented to person, place, and time. Mental status is at baseline.      Gait: Gait normal.  Psychiatric:        Attention and Perception: Attention and perception normal.  Mood and Affect: Mood and affect normal.        Speech: Speech normal.        Behavior: Behavior normal. Behavior is cooperative.        Thought Content: Thought content normal.        Cognition and Memory: Cognition and memory normal.        Judgment: Judgment normal.     Assessment  Plan  Type 2 diabetes mellitus without complication, without long-term current use of insulin ([place]) - Plan: Comprehensive metabolic panel, Lipid panel, CBC with Differential/Platelet, Urinalysis, Routine w reflex microscopic, Hemoglobin A1c, Microalbumin / creatinine urine ratio Cont metformin  Check labs 09/10/19  Assess for ARB/CC or statin  Foot exam today 07/11/19 referred to foot md for clavi to feet   HM Flu shot utd pna 23 had 04/14/16 Consider Tdap, shingrix, prevnar   cologuard ordered today Check PSA never had  Skin referred dermatology Ak changes and ? nmsc to left arm esp.  TFC referred for clavi to feet  Former smoker since age 71 y.o max 3 ppd quit in 2009 or 2011. No FH lung cancer  -consider calc CT risk score to see if qualifies CT chest  Get records [facility]   Provider: Dr. Olivia Mackie McLean-Scocuzza-Internal Medicine

## 2019-07-11 NOTE — Patient Instructions (Signed)
We ordered cologuard for you today please do on a Monday  We referred you to dermatology on [address]  We referred to to [facility] doctors  Please get centrum silver for men or nature made multivitamin and take it daily   Take care    Corns and Calluses Corns are small areas of thickened skin that occur on the top, sides, or tip of a toe. They contain a cone-shaped core with a point that can press on a nerve below. This causes pain.  Calluses are areas of thickened skin that can occur anywhere on the body, including the hands, fingers, palms, soles of the feet, and heels. Calluses are usually larger than corns. What are the causes? Corns and calluses are caused by rubbing (friction) or pressure, such as from shoes that are too tight or do not fit properly. What increases the risk? Corns are more likely to develop in people who have misshapen toes (toe deformities), such as hammer toes. Calluses can occur with friction to any area of the skin. They are more likely to develop in people who:  Work with their hands.  Wear shoes that fit poorly, are too tight, or are high-heeled.  Have toe deformities. What are the signs or symptoms? Symptoms of a corn or callus include:  A hard growth on the skin.  Pain or tenderness under the skin.  Redness and swelling.  Increased discomfort while wearing tight-fitting shoes, if your feet are affected. If a corn or callus becomes infected, symptoms may include:  Redness and swelling that gets worse.  Pain.  Fluid, blood, or pus draining from the corn or callus. How is this diagnosed? Corns and calluses may be diagnosed based on your symptoms, your medical history, and a physical exam. How is this treated? Treatment for corns and calluses may include:  Removing the cause of the friction or pressure. This may involve: ? Changing your shoes. ? Wearing shoe inserts (orthotics) or other protective layers in your shoes, such as a  corn pad. ? Wearing gloves.  Applying medicine to the skin (topical medicine) to help soften skin in the hardened, thickened areas.  Removing layers of dead skin with a file to reduce the size of the corn or callus.  Removing the corn or callus with a scalpel or laser.  Taking antibiotic medicines, if your corn or callus is infected.  Having surgery, if a toe deformity is the cause. Follow these instructions at home:   Take over-the-counter and prescription medicines only as told by your health care provider.  If you were prescribed an antibiotic, take it as told by your health care provider. Do not stop taking it even if your condition starts to improve.  Wear shoes that fit well. Avoid wearing high-heeled shoes and shoes that are too tight or too loose.  Wear any padding, protective layers, gloves, or orthotics as told by your health care provider.  Soak your hands or feet and then use a file or pumice stone to soften your corn or callus. Do this as told by your health care provider.  Check your corn or callus every day for symptoms of infection. Contact a health care provider if you:  Notice that your symptoms do not improve with treatment.  Have redness or swelling that gets worse.  Notice that your corn or callus becomes painful.  Have fluid, blood, or pus coming from your corn or callus.  Have new symptoms. Summary  Corns are small areas  of thickened skin that occur on the top, sides, or tip of a toe.  Calluses are areas of thickened skin that can occur anywhere on the body, including the hands, fingers, palms, and soles of the feet. Calluses are usually larger than corns.  Corns and calluses are caused by rubbing (friction) or pressure, such as from shoes that are too tight or do not fit properly.  Treatment may include wearing any padding, protective layers, gloves, or orthotics as told by your health care provider. This information is not intended to replace  advice given to you by your health care provider. Make sure you discuss any questions you have with your health care provider. Document Revised: 09/25/2018 Document Reviewed: 04/18/2017 Elsevier Patient Education  [place].  Diabetes Mellitus and Nutrition, Adult When you have diabetes (diabetes mellitus), it is very important to have healthy eating habits because your blood sugar (glucose) levels are greatly affected by what you eat and drink. Eating healthy foods in the appropriate amounts, at about the same times every day, can help you:  Control your blood glucose.  Lower your risk of heart disease.  Improve your blood pressure.  Reach or maintain a healthy weight. Every person with diabetes is different, and each person has different needs for a meal plan. Your health care provider may recommend that you work with a diet and nutrition specialist (dietitian) to make a meal plan that is best for you. Your meal plan may vary depending on factors such as:  The calories you need.  The medicines you take.  Your weight.  Your blood glucose, blood pressure, and cholesterol levels.  Your activity level.  Other health conditions you have, such as heart or kidney disease. How do carbohydrates affect me? Carbohydrates, also called carbs, affect your blood glucose level more than any other type of food. Eating carbs naturally raises the amount of glucose in your blood. Carb counting is a method for keeping track of how many carbs you eat. Counting carbs is important to keep your blood glucose at a healthy level, especially if you use insulin or take certain oral diabetes medicines. It is important to know how many carbs you can safely have in each meal. This is different for every person. Your dietitian can help you calculate how many carbs you should have at each meal and for each snack. Foods that contain carbs include:  Bread, cereal, rice, pasta, and crackers.  Potatoes and  corn.  Peas, beans, and lentils.  Milk and yogurt.  Fruit and juice.  Desserts, such as cakes, cookies, ice cream, and candy. How does alcohol affect me? Alcohol can cause a sudden decrease in blood glucose (hypoglycemia), especially if you use insulin or take certain oral diabetes medicines. Hypoglycemia can be a life-threatening condition. Symptoms of hypoglycemia (sleepiness, dizziness, and confusion) are similar to symptoms of having too much alcohol. If your health care provider says that alcohol is safe for you, follow these guidelines:  Limit alcohol intake to no more than 1 drink per day for nonpregnant women and 2 drinks per day for men. One drink equals 12 oz of beer, 5 oz of wine, or 1 oz of hard liquor.  Do not drink on an empty stomach.  Keep yourself hydrated with water, diet soda, or unsweetened iced tea.  Keep in mind that regular soda, juice, and other mixers may contain a lot of sugar and must be counted as carbs. What are tips for following this plan?  Reading food labels  Start by checking the serving size on the "Nutrition Facts" label of packaged foods and drinks. The amount of calories, carbs, fats, and other nutrients listed on the label is based on one serving of the item. Many items contain more than one serving per package.  Check the total grams (g) of carbs in one serving. You can calculate the number of servings of carbs in one serving by dividing the total carbs by 15. For example, if a food has 30 g of total carbs, it would be equal to 2 servings of carbs.  Check the number of grams (g) of saturated and trans fats in one serving. Choose foods that have low or no amount of these fats.  Check the number of milligrams (mg) of salt (sodium) in one serving. Most people should limit total sodium intake to less than 2,300 mg per day.  Always check the nutrition information of foods labeled as "low-fat" or "nonfat". These foods may be higher in added sugar or  refined carbs and should be avoided.  Talk to your dietitian to identify your daily goals for nutrients listed on the label. Shopping  Avoid buying canned, premade, or processed foods. These foods tend to be high in fat, sodium, and added sugar.  Shop around the outside edge of the grocery store. This includes fresh fruits and vegetables, bulk grains, fresh meats, and fresh dairy. Cooking  Use low-heat cooking methods, such as baking, instead of high-heat cooking methods like deep frying.  Cook using healthy oils, such as olive, canola, or sunflower oil.  Avoid cooking with butter, cream, or high-fat meats. Meal planning  Eat meals and snacks regularly, preferably at the same times every day. Avoid going long periods of time without eating.  Eat foods high in fiber, such as fresh fruits, vegetables, beans, and whole grains. Talk to your dietitian about how many servings of carbs you can eat at each meal.  Eat 4-6 ounces (oz) of lean protein each day, such as lean meat, chicken, fish, eggs, or tofu. One oz of lean protein is equal to: ? 1 oz of meat, chicken, or fish. ? 1 egg. ?  cup of tofu.  Eat some foods each day that contain healthy fats, such as avocado, nuts, seeds, and fish. Lifestyle  Check your blood glucose regularly.  Exercise regularly as told by your health care provider. This may include: ? 150 minutes of moderate-intensity or vigorous-intensity exercise each week. This could be brisk walking, biking, or water aerobics. ? Stretching and doing strength exercises, such as yoga or weightlifting, at least 2 times a week.  Take medicines as told by your health care provider.  Do not use any products that contain nicotine or tobacco, such as cigarettes and e-cigarettes. If you need help quitting, ask your health care provider.  Work with a Social worker or diabetes educator to identify strategies to manage stress and any emotional and social challenges. Questions to ask a  health care provider  Do I need to meet with a diabetes educator?  Do I need to meet with a dietitian?  What number can I call if I have questions?  When are the best times to check my blood glucose? Where to find more information:  American Diabetes Association: diabetes.org  Academy of Nutrition and Dietetics: www.eatright.[employer] of Diabetes and Digestive and Kidney Diseases (NIH): [employer] Summary  A healthy meal plan will help you control your blood glucose and maintain a healthy  lifestyle.  Working with a diet and nutrition specialist (dietitian) can help you make a meal plan that is best for you.  Keep in mind that carbohydrates (carbs) and alcohol have immediate effects on your blood glucose levels. It is important to count carbs and to use alcohol carefully. This information is not intended to replace advice given to you by your health care provider. Make sure you discuss any questions you have with your health care provider. Document Revised: 05/18/2017 Document Reviewed: 07/10/2016 Elsevier Patient Education  2020 [employer].

## 2019-07-18 ENCOUNTER — Ambulatory Visit: Payer: Self-pay | Admitting: Podiatry

## 2019-08-04 LAB — COLOGUARD: Cologuard: POSITIVE — AB

## 2019-08-06 LAB — COLOGUARD

## 2019-08-07 ENCOUNTER — Telehealth: Payer: Self-pay | Admitting: Internal Medicine

## 2019-08-07 NOTE — Telephone Encounter (Signed)
Left message to return call 

## 2019-08-07 NOTE — Telephone Encounter (Signed)
His stool cologuard sample could not be processed he will have to do this again from 08/04/19   They should contact him and mail him another stool kit   [place]

## 2019-08-08 NOTE — Telephone Encounter (Signed)
Left message to return call 

## 2019-08-20 DIAGNOSIS — L859 Epidermal thickening, unspecified: Secondary | ICD-10-CM | POA: Diagnosis not present

## 2019-08-20 DIAGNOSIS — L821 Other seborrheic keratosis: Secondary | ICD-10-CM | POA: Diagnosis not present

## 2019-08-20 DIAGNOSIS — D485 Neoplasm of uncertain behavior of skin: Secondary | ICD-10-CM | POA: Diagnosis not present

## 2019-08-20 DIAGNOSIS — D1801 Hemangioma of skin and subcutaneous tissue: Secondary | ICD-10-CM | POA: Diagnosis not present

## 2019-08-20 DIAGNOSIS — L720 Epidermal cyst: Secondary | ICD-10-CM | POA: Diagnosis not present

## 2019-08-20 DIAGNOSIS — L578 Other skin changes due to chronic exposure to nonionizing radiation: Secondary | ICD-10-CM | POA: Diagnosis not present

## 2019-08-20 DIAGNOSIS — L820 Inflamed seborrheic keratosis: Secondary | ICD-10-CM | POA: Diagnosis not present

## 2019-09-10 ENCOUNTER — Other Ambulatory Visit: Payer: Medicare Other

## 2019-09-11 ENCOUNTER — Ambulatory Visit: Payer: Medicare Other | Admitting: Family Medicine

## 2019-10-22 ENCOUNTER — Ambulatory Visit: Payer: Medicare Other | Admitting: Dermatology

## 2019-11-05 ENCOUNTER — Telehealth: Payer: Self-pay | Admitting: Internal Medicine

## 2019-11-05 NOTE — Telephone Encounter (Signed)
Cologuard scanned to chart on 08/04/19 was patient called and advised specimen could not be processed? Would need to order cologuard? And advise patient. Can we do a recollect?

## 2019-11-06 ENCOUNTER — Other Ambulatory Visit: Payer: Self-pay | Admitting: Internal Medicine

## 2019-11-06 NOTE — Telephone Encounter (Signed)
We can reorder 2nd test  [place]

## 2019-11-07 NOTE — Telephone Encounter (Signed)
Left message to return call 

## 2019-11-12 NOTE — Telephone Encounter (Signed)
Faxed Cologurad order form

## 2019-11-14 NOTE — Telephone Encounter (Signed)
Left message to return call. Letter sent

## 2019-12-19 ENCOUNTER — Encounter: Payer: Self-pay | Admitting: Internal Medicine

## 2019-12-19 ENCOUNTER — Ambulatory Visit: Payer: Medicare Other | Admitting: Internal Medicine

## 2019-12-19 ENCOUNTER — Telehealth: Payer: Self-pay | Admitting: Internal Medicine

## 2019-12-19 NOTE — Telephone Encounter (Signed)
Patient no-showed today's appointment; appointment was for 12/19/19 at 1:30 pm, provider notified for review of record. Letter sent to reschedule.

## 2019-12-23 ENCOUNTER — Telehealth: Payer: Self-pay | Admitting: Internal Medicine

## 2019-12-23 NOTE — Telephone Encounter (Signed)
No answer, no voicemail.  Patient's sample for Cologuard was not sufficient and he will need to repeat collection.   Letter has been sent to the Patient on this matter. Have been unable to contact the Patient since 07/2019/

## 2019-12-30 ENCOUNTER — Ambulatory Visit (INDEPENDENT_AMBULATORY_CARE_PROVIDER_SITE_OTHER): Payer: Medicare Other | Admitting: Internal Medicine

## 2019-12-30 ENCOUNTER — Other Ambulatory Visit: Payer: Self-pay

## 2019-12-30 ENCOUNTER — Ambulatory Visit (INDEPENDENT_AMBULATORY_CARE_PROVIDER_SITE_OTHER): Payer: Medicare Other

## 2019-12-30 ENCOUNTER — Encounter: Payer: Self-pay | Admitting: Internal Medicine

## 2019-12-30 VITALS — BP 140/82 | HR 60 | Temp 98.3°F | Ht 70.5 in | Wt 143.8 lb

## 2019-12-30 DIAGNOSIS — N401 Enlarged prostate with lower urinary tract symptoms: Secondary | ICD-10-CM

## 2019-12-30 DIAGNOSIS — N3281 Overactive bladder: Secondary | ICD-10-CM

## 2019-12-30 DIAGNOSIS — R634 Abnormal weight loss: Secondary | ICD-10-CM

## 2019-12-30 DIAGNOSIS — I152 Hypertension secondary to endocrine disorders: Secondary | ICD-10-CM

## 2019-12-30 DIAGNOSIS — E559 Vitamin D deficiency, unspecified: Secondary | ICD-10-CM | POA: Diagnosis not present

## 2019-12-30 DIAGNOSIS — R5383 Other fatigue: Secondary | ICD-10-CM

## 2019-12-30 DIAGNOSIS — Z13818 Encounter for screening for other digestive system disorders: Secondary | ICD-10-CM

## 2019-12-30 DIAGNOSIS — I10 Essential (primary) hypertension: Secondary | ICD-10-CM

## 2019-12-30 DIAGNOSIS — G8929 Other chronic pain: Secondary | ICD-10-CM | POA: Diagnosis not present

## 2019-12-30 DIAGNOSIS — M545 Low back pain: Secondary | ICD-10-CM | POA: Diagnosis not present

## 2019-12-30 DIAGNOSIS — R972 Elevated prostate specific antigen [PSA]: Secondary | ICD-10-CM

## 2019-12-30 DIAGNOSIS — E1159 Type 2 diabetes mellitus with other circulatory complications: Secondary | ICD-10-CM | POA: Insufficient documentation

## 2019-12-30 DIAGNOSIS — M5442 Lumbago with sciatica, left side: Secondary | ICD-10-CM

## 2019-12-30 DIAGNOSIS — R195 Other fecal abnormalities: Secondary | ICD-10-CM

## 2019-12-30 DIAGNOSIS — E538 Deficiency of other specified B group vitamins: Secondary | ICD-10-CM | POA: Diagnosis not present

## 2019-12-30 DIAGNOSIS — R3916 Straining to void: Secondary | ICD-10-CM | POA: Diagnosis not present

## 2019-12-30 DIAGNOSIS — R638 Other symptoms and signs concerning food and fluid intake: Secondary | ICD-10-CM

## 2019-12-30 LAB — LIPID PANEL
Cholesterol: 186 mg/dL (ref 0–200)
HDL: 44.5 mg/dL (ref >39.00)
LDL Cholesterol: 104 mg/dL — ABNORMAL HIGH (ref 0–99)
NonHDL: 141.16
Total CHOL/HDL Ratio: 4
Triglycerides: 185 mg/dL — ABNORMAL HIGH (ref 0.0–149.0)
VLDL: 37 mg/dL (ref 0.0–40.0)

## 2019-12-30 LAB — COMPREHENSIVE METABOLIC PANEL
ALT: 10 U/L (ref 0–53)
AST: 12 U/L (ref 0–37)
Albumin: 4.2 g/dL (ref 3.5–5.2)
Alkaline Phosphatase: 60 U/L (ref 39–117)
BUN: 25 mg/dL — ABNORMAL HIGH (ref 6–23)
CO2: 28 mEq/L (ref 19–32)
Calcium: 9.5 mg/dL (ref 8.4–10.5)
Chloride: 102 mEq/L (ref 96–112)
Creatinine, Ser: 1.33 mg/dL (ref 0.40–1.50)
GFR: 52 mL/min — ABNORMAL LOW (ref >60.00)
Glucose, Bld: 178 mg/dL — ABNORMAL HIGH (ref 70–99)
Potassium: 4.9 mEq/L (ref 3.5–5.1)
Sodium: 138 mEq/L (ref 135–145)
Total Bilirubin: 0.4 mg/dL (ref 0.2–1.2)
Total Protein: 6.9 g/dL (ref 6.0–8.3)

## 2019-12-30 LAB — CBC WITH DIFFERENTIAL/PLATELET
Basophils Absolute: 0.1 10*3/uL (ref 0.0–0.1)
Basophils Relative: 1.3 % (ref 0.0–3.0)
Eosinophils Absolute: 0.2 10*3/uL (ref 0.0–0.7)
Eosinophils Relative: 2.6 % (ref 0.0–5.0)
HCT: 40.1 % (ref 39.0–52.0)
Hemoglobin: 13 g/dL (ref 13.0–17.0)
Lymphocytes Relative: 30.6 % (ref 12.0–46.0)
Lymphs Abs: 1.9 10*3/uL (ref 0.7–4.0)
MCHC: 32.4 g/dL (ref 30.0–36.0)
MCV: 89.3 fl (ref 78.0–100.0)
Monocytes Absolute: 0.5 10*3/uL (ref 0.1–1.0)
Monocytes Relative: 7.9 % (ref 3.0–12.0)
Neutro Abs: 3.6 10*3/uL (ref 1.4–7.7)
Neutrophils Relative %: 57.6 % (ref 43.0–77.0)
Platelets: 339 10*3/uL (ref 150.0–400.0)
RBC: 4.49 Mil/uL (ref 4.22–5.81)
RDW: 14.2 % (ref 11.5–15.5)
WBC: 6.2 10*3/uL (ref 4.0–10.5)

## 2019-12-30 LAB — PSA: PSA: 5.21 ng/mL — ABNORMAL HIGH (ref 0.10–4.00)

## 2019-12-30 LAB — HEMOGLOBIN A1C: Hgb A1c MFr Bld: 8.4 % — ABNORMAL HIGH (ref 4.6–6.5)

## 2019-12-30 LAB — VITAMIN B12: Vitamin B-12: 162 pg/mL — ABNORMAL LOW (ref 211–911)

## 2019-12-30 LAB — VITAMIN D 25 HYDROXY (VIT D DEFICIENCY, FRACTURES): VITD: 36.47 ng/mL (ref 30.00–100.00)

## 2019-12-30 LAB — TSH: TSH: 2.53 u[IU]/mL (ref 0.35–4.50)

## 2019-12-30 NOTE — Patient Instructions (Addendum)
Lidocaine pain patch over the counter  voltaren gel  We will refer you to GI doctor  Order ultrasound abdomen    Try premier protein shake or glucerna  Memory Compensation Strategies  1. Use "WARM" strategy.  W= write it down  A= associate it  R= repeat it  M= make a mental note  2.   You can keep a Social worker.  Use a 3-ring notebook with sections for the following: calendar, important names and phone numbers,  medications, doctors' names/phone numbers, lists/reminders, and a section to journal what you did  each day.   3.    Use a calendar to write appointments down.  4.    Write yourself a schedule for the day.  This can be placed on the calendar or in a separate section of the Memory Notebook.  Keeping a  regular schedule can help memory.  5.    Use medication organizer with sections for each day or morning/evening pills.  You may need help loading it  6.    Keep a basket, or pegboard by the door.  Place items that you need to take out with you in the basket or on the pegboard.  You may also want to  include a message board for reminders.  7.    Use sticky notes.  Place sticky notes with reminders in a place where the task is performed.  For example: " turn off the  stove" placed by the stove, "lock the door" placed on the door at eye level, " take your medications" on  the bathroom mirror or by the place where you normally take your medications.  8.    Use alarms/timers.  Use while cooking to remind yourself to check on food or as a reminder to take your medicine, or as a  reminder to make a call, or as a reminder to perform another task, etc.

## 2019-12-30 NOTE — Addendum Note (Signed)
Addended by: Leeanne Rio on: 12/30/2019 11:20 AM   Modules accepted: Orders

## 2019-12-30 NOTE — Progress Notes (Signed)
Chief Complaint  Patient presents with  . Follow-up   F/u with wife Remo Lipps  1. C/o memory loss short term, fatigue, urine and bowel incontinence, reduced po at times will not eat dinner, straining to urinate and bowel movements on himself 2. DM 2 A1C 6.5  3. C/o left lower back pain 1 month ago fell and has pain running down his left leg worse during the day with walking  Tried aspercream and lidocaine pain patches   Review of Systems  Constitutional: Positive for malaise/fatigue and weight loss.  HENT: Negative for hearing loss.   Eyes: Negative for blurred vision.  Respiratory: Negative for shortness of breath.   Cardiovascular: Negative for chest pain.  Gastrointestinal: Negative for abdominal pain.  Genitourinary:       +trouble urinating   Musculoskeletal: Positive for back pain and falls.  Skin: Negative for rash.  Neurological: Negative for headaches.  Psychiatric/Behavioral: Positive for memory loss.   Past Medical History:  Diagnosis Date  . Arthritis   . Diabetes mellitus without complication ([place])   . Pain    RIGHT SHOULDER   . Retinopathy 04/19/2018   No diabetic Retinopathy  . Stroke [facility])    TIA 2017  . TIA (transient ischemic attack)    Past Surgical History:  Procedure Laterality Date  . BACK SURGERY    . CATARACT EXTRACTION W/PHACO Right 08/22/2018   Procedure: CATARACT EXTRACTION PHACO AND INTRAOCULAR LENS PLACEMENT (IOC)-RIGHT;  Surgeon: Marchia Meiers, MD;  Location: [facility] ORS;  Service: Ophthalmology;  Laterality: Right;  [place] 02:18.0CDE 24.65Fluid Pack Lot # [id number]  . CATARACT EXTRACTION W/PHACO Left 12/05/2018   Procedure: CATARACT EXTRACTION PHACO AND INTRAOCULAR LENS PLACEMENT (IOC) LEFT, DIABETIC;  Surgeon: Marchia Meiers, MD;  Location: [facility] ORS;  Service: Ophthalmology;  Laterality: Left;  [place] 00:55.8 AP% 12.2 CDE 8.10 Fluid Pack Lot # [id number]  . HERNIA REPAIR     Family History  Problem Relation Age of Onset  . Cancer Mother        ?type  .  Cancer Father        ?type  . Cancer Brother        ?GB cancer    Social History   Socioeconomic History  . Marital status: Married    Spouse name: Not on file  . Number of children: Not on file  . Years of education: Not on file  . Highest education level: Not on file  Occupational History  . Not on file  Tobacco Use  . Smoking status: Former Smoker    Packs/day: 1.50    Years: 15.00    Pack years: 22.50    Types: Cigarettes    Quit date: 06/19/2005    Years since quitting: 14.5  . Smokeless tobacco: Current User    Types: Chew  Substance and Sexual Activity  . Alcohol use: Yes    Alcohol/week: 4.0 - 5.0 standard drinks    Types: 4 - 5 Cans of beer per week    Comment: OCCAS  . Drug use: Never  . Sexual activity: Not Currently    Partners: Female  Other Topics Concern  . 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 Difficulty of Paying Living Expenses: Not hard at all  Food Insecurity: No Food Insecurity  . Worried About Charity fundraiser in the Last Year: Never true  . Ran Out of Food in the Last Year: Never true  Transportation Needs: No Transportation Needs  . Lack of Transportation (Medical): No  . Lack of Transportation (Non-Medical): No  Physical Activity: Insufficiently Active  . Days of Exercise per Week: 4 days  . Minutes of Exercise per Session: 20 min  Stress: No Stress Concern Present  . Feeling of Stress : Not at all  Social Connections: Unknown  . Frequency of Communication with Friends and Family: Not on file  . Frequency of Social Gatherings with Friends and Family: Not on file  . Attends Religious Services: Not on file  . Active Member of Clubs or  Organizations: Not on file  . Attends Archivist Meetings: Not on file  . Marital Status: Married  Human resources officer Violence: Not At Risk  . Fear of Current or Ex-Partner: No  . Emotionally Abused: No  . Physically Abused: No  . Sexually Abused: No   Current Meds  Medication Sig  . aspirin EC 325 MG EC tablet Take 1 tablet (325 mg total) by mouth daily.  . metFORMIN (GLUCOPHAGE) 500 MG tablet Take 1 tablet (500 mg total) by mouth 2 (two) times daily with a meal.   No Known Allergies No results found for this or any previous visit (from the past 2160 hour(s)). Objective  Body mass index is 20.34 kg/m. Wt Readings from Last 3 Encounters:  12/30/19 143 lb 12.8 oz (65.2 kg)  07/11/19 151 lb (68.5 kg)  03/13/19 140 lb 3.2 oz (63.6 kg)   Temp Readings from Last 3 Encounters:  12/30/19 98.3 F (36.8 C) (Oral)  07/11/19 97.7 F (36.5 C) (Skin)  03/13/19 98.2 F (36.8 C)   BP Readings from Last 3 Encounters:  12/30/19 140/82  07/11/19 132/70  03/13/19 136/86   Pulse Readings from Last 3 Encounters:  12/30/19 60  07/11/19 64  03/13/19 (!) 58    Physical Exam Vitals and nursing note reviewed.  Constitutional:      Appearance: Normal appearance. He is well-developed and well-groomed.  HENT:     Head: Normocephalic and atraumatic.  Eyes:     Conjunctiva/sclera: Conjunctivae normal.     Pupils: Pupils are equal, round, and reactive to light.  Cardiovascular:     Rate and Rhythm: Normal rate and regular rhythm.     Heart sounds: Normal heart sounds. No murmur heard.   Pulmonary:     Effort: Pulmonary effort is normal.     Breath sounds: Normal breath sounds.  Abdominal:     General: Abdomen is flat. Bowel sounds are normal.     Tenderness: There is no abdominal tenderness.  Skin:    General: Skin is warm and dry.  Neurological:     General: No focal deficit present.     Mental Status: He is alert and oriented to person, place, and time. Mental status is at  baseline.     Gait: Gait normal.  Psychiatric:        Attention and Perception: Attention and perception normal.        Mood and Affect: Mood and affect normal.        Speech: Speech normal.  Behavior: Behavior normal. Behavior is cooperative.        Thought Content: Thought content normal.        Cognition and Memory: Cognition and memory normal.        Judgment: Judgment normal.     Assessment  Plan  Chronic left-sided low back pain with left-sided sciatica - Plan: DG Lumbar Spine Complete Tylenol   Weight loss - Plan: DG Chest 2 View, US Abdomen Complete Premier protein, given glucerna samples today  S/u with labs   Fatigue, unspecified type - Plan: Comprehensive metabolic panel, CBC with Differential/Platelet, TSH, Urinalysis, Routine w reflex microscopic, Vitamin D (25 hydroxy), B12, DG Chest 2 View, US Abdomen Complete  Hypertension associated with diabetes ([place]) - Plan: Comprehensive metabolic panel, CBC with Differential/Platelet, Lipid panel, Hemoglobin A1c, Microalbumin / creatinine urine ratio Consider ARB,CCB in future   Overactive bladder - Plan: Urinalysis, Routine w reflex microscopic, Urine Culture Benign prostatic hyperplasia (BPH) with straining on urination - Plan: PSA  HM Flu shot utd pna 23 had 04/14/16 Consider Tdap, shingrix, prevnar   cologuard ordered but not complete needs to do again  Check PSA never had  Skin referred dermatology [place] changes and ? nmsc to left arm esp.  [facility] referred for clavi to feet  Former smoker since age 22 y.o max 3 ppd quit in 2009 or 2011. No FH lung cancer  -consider calc CT risk score to see if qualifies CT chest  Get records [place] [facility]  Referral [facility] Add on hep C  Provider: Dr. Olivia Mackie McLean-Scocuzza-Internal Medicine

## 2019-12-31 ENCOUNTER — Other Ambulatory Visit: Payer: Self-pay | Admitting: Internal Medicine

## 2019-12-31 DIAGNOSIS — E785 Hyperlipidemia, unspecified: Secondary | ICD-10-CM

## 2019-12-31 DIAGNOSIS — R972 Elevated prostate specific antigen [PSA]: Secondary | ICD-10-CM | POA: Insufficient documentation

## 2019-12-31 LAB — URINALYSIS, ROUTINE W REFLEX MICROSCOPIC
Bilirubin Urine: NEGATIVE
Hgb urine dipstick: NEGATIVE
Ketones, ur: NEGATIVE
Leukocytes,Ua: NEGATIVE
Nitrite: NEGATIVE
Protein, ur: NEGATIVE
Specific Gravity, Urine: 1.025 (ref 1.001–1.03)
pH: <=5 (ref 5.0–8.0)

## 2019-12-31 LAB — MICROALBUMIN / CREATININE URINE RATIO
Creatinine, Urine: 211 mg/dL (ref 20–320)
Microalb Creat Ratio: 5 mcg/mg creat (ref <30)
Microalb, Ur: 1 mg/dL

## 2019-12-31 LAB — URINE CULTURE
MICRO NUMBER:: 10698401
Result:: NO GROWTH
SPECIMEN QUALITY:: ADEQUATE

## 2019-12-31 LAB — HEPATITIS C ANTIBODY
Hepatitis C Ab: NONREACTIVE
SIGNAL TO CUT-OFF: 0.06 (ref <1.00)

## 2019-12-31 MED ORDER — GLIPIZIDE ER 2.5 MG PO TB24: 2.5000 mg | ORAL_TABLET | Freq: Every day | ORAL | 3 refills | Status: DC

## 2019-12-31 MED ORDER — PRAVASTATIN SODIUM 20 MG PO TABS: 20.0000 mg | ORAL_TABLET | Freq: Every day | ORAL | 3 refills | Status: DC

## 2019-12-31 MED ORDER — GABAPENTIN 300 MG PO CAPS: 300.0000 mg | ORAL_CAPSULE | Freq: Every day | ORAL | 3 refills | Status: DC

## 2019-12-31 NOTE — Addendum Note (Signed)
Addended by: Orland Mustard on: 12/31/2019 01:12 PM   Modules accepted: Orders

## 2020-01-01 ENCOUNTER — Ambulatory Visit (INDEPENDENT_AMBULATORY_CARE_PROVIDER_SITE_OTHER): Payer: Medicare Other

## 2020-01-01 ENCOUNTER — Other Ambulatory Visit: Payer: Self-pay

## 2020-01-01 DIAGNOSIS — E538 Deficiency of other specified B group vitamins: Secondary | ICD-10-CM | POA: Diagnosis not present

## 2020-01-01 MED ORDER — CYANOCOBALAMIN 1000 MCG/ML IJ SOLN
1000.0000 ug | Freq: Once | INTRAMUSCULAR | Status: AC
Start: 2020-01-01 — End: 2020-01-01
  Administered 2020-01-01: 1000 ug via INTRAMUSCULAR

## 2020-01-01 NOTE — Progress Notes (Signed)
Patient presented for B 12 injection to left deltoid, patient voiced no concerns nor showed any signs of distress during injection. 

## 2020-01-05 ENCOUNTER — Ambulatory Visit: Payer: Medicare Other

## 2020-01-07 ENCOUNTER — Other Ambulatory Visit: Payer: Self-pay

## 2020-01-07 ENCOUNTER — Ambulatory Visit (INDEPENDENT_AMBULATORY_CARE_PROVIDER_SITE_OTHER): Payer: Medicare Other | Admitting: Gastroenterology

## 2020-01-07 VITALS — BP 137/82 | HR 68 | Temp 98.1°F | Ht 70.0 in | Wt 146.0 lb

## 2020-01-07 DIAGNOSIS — R634 Abnormal weight loss: Secondary | ICD-10-CM

## 2020-01-07 NOTE — Progress Notes (Signed)
Eric Bellows MD, MRCP(U.K) [address]  [place]  [place], [place] [address]  Main: [phone]  Fax: [phone]   Gastroenterology Consultation  Referring Provider:     McLean-Scocuzza, Olivia Morris * Primary Care Physician:  McLean-Scocuzza, Eric Glow, MD Primary Gastroenterologist:  Dr. Jonathon Morris  Reason for Consultation:     Unintentional weight loss        HPI:   Eric Morris is a 79 y.o. y/o male referred for consultation & management  by Dr. Terese Morris, Eric Glow, MD.     He has been referred for unintentional weight loss.  Never had a colonoscopy.  Recent labs on 12/30/2019 demonstrated a hemoglobin of 13 g with an MCV of 89.3.  Urinalysis showed no blood.  CMP was normal except for a creatinine of 1.33.  TSH normal.  HbA1c 8.4.  B12 low at 162.  Commenced on B12 shots.  Cologuard in February 2021 was asked to be r repeated.  No recent imaging.  He denies any abdominal pain, change in bowel habits, rectal bleeding.  Not on any blood thinners except aspirin.  Past Medical History:  Diagnosis Date  . Arthritis   . Diabetes mellitus without complication ([place])   . Pain    RIGHT SHOULDER   . Retinopathy 04/19/2018   No diabetic Retinopathy  . Stroke [facility])    TIA 2017  . TIA (transient ischemic attack)     Past Surgical History:  Procedure Laterality Date  . BACK SURGERY    . CATARACT EXTRACTION W/PHACO Right 08/22/2018   Procedure: CATARACT EXTRACTION PHACO AND INTRAOCULAR LENS PLACEMENT (IOC)-RIGHT;  Surgeon: Eric Meiers, MD;  Location: [facility] ORS;  Service: Ophthalmology;  Laterality: Right;  [place] 02:18.0CDE 24.65Fluid Pack Lot # [id number]  . CATARACT EXTRACTION W/PHACO Left 12/05/2018   Procedure: CATARACT EXTRACTION PHACO AND INTRAOCULAR LENS PLACEMENT (IOC) LEFT, DIABETIC;  Surgeon: Eric Meiers, MD;  Location: [facility] ORS;  Service: Ophthalmology;  Laterality: Left;  [place] 00:55.8 AP% 12.2 CDE 8.10 Fluid Pack Lot # [id number]  . HERNIA REPAIR      Prior to  Admission medications   Medication Sig Start Date End Date Taking? Authorizing Provider  aspirin EC 325 MG EC tablet Take 1 tablet (325 mg total) by mouth daily. 04/15/16   Eric Mango, MD  gabapentin (NEURONTIN) 300 MG capsule Take 1 capsule (300 mg total) by mouth at bedtime. 12/31/19   McLean-Scocuzza, Eric Glow, MD  glipiZIDE (GLUCOTROL XL) 2.5 MG 24 hr tablet Take 1 tablet (2.5 mg total) by mouth daily with breakfast. 12/31/19   McLean-Scocuzza, Eric Glow, MD  metFORMIN (GLUCOPHAGE) 500 MG tablet Take 1 tablet (500 mg total) by mouth 2 (two) times daily with a meal. 06/24/19   Eric Haven, MD  pravastatin (PRAVACHOL) 20 MG tablet Take 1 tablet (20 mg total) by mouth at bedtime. 12/31/19   McLean-Scocuzza, Eric Glow, MD    Family History  Problem Relation Age of Onset  . Cancer Mother        ?type  . Cancer Father        ?type  . Cancer Brother        ?GB cancer      Social History   Tobacco Use  . Smoking status: Former Smoker    Packs/day: 1.50    Years: 15.00    Pack years: 22.50    Types: Cigarettes    Quit date: 06/19/2005    Years since quitting: 14.5  . Smokeless tobacco: Current  User    Types: Chew  Substance Use Topics  . Alcohol use: Yes    Alcohol/week: 4.0 - 5.0 standard drinks    Types: 4 - 5 Cans of beer per week    Comment: OCCAS  . Drug use: Never    Allergies as of 01/07/2020  . (No Known Allergies)    Review of Systems:    All systems reviewed and negative except where noted in HPI.   Physical Exam:  There were no vitals taken for this visit. No LMP for male patient. Psych:  Alert and cooperative. Normal mood and affect. General:   Alert,  Well-developed, well-nourished, pleasant and cooperative in NAD Head:  Normocephalic and atraumatic. Eyes:  Sclera clear, no icterus.   Conjunctiva pink. Ears:  Normal auditory acuity. Lungs:  Respirations even and unlabored.  Clear throughout to auscultation.   No wheezes, crackles, or rhonchi. No acute  distress. Heart:  Regular rate and rhythm; no murmurs, clicks, rubs, or gallops. Abdomen:  Normal bowel sounds.  No bruits.  Soft, non-tender and non-distended without masses, hepatosplenomegaly or hernias noted.  No guarding or rebound tenderness.    Neurologic:  Alert and oriented x3;  grossly normal neurologically. Psych:  Alert and cooperative. Normal mood and affect.  Imaging Studies: DG Chest 2 View  Result Date: 12/30/2019 CLINICAL DATA:  Fatigue and weight loss. EXAM: CHEST - 2 VIEW COMPARISON:  04/12/2016 FINDINGS: The heart size and mediastinal contours are within normal limits. Both lungs are clear. The visualized skeletal structures are unremarkable. IMPRESSION: No active cardiopulmonary disease. Electronically Signed   By: Eric Morris M.D.   On: 12/30/2019 15:50   DG Lumbar Spine Complete  Result Date: 12/30/2019 CLINICAL DATA:  Chronic left low back pain with left-sided sciatica. EXAM: LUMBAR SPINE - COMPLETE 4+ VIEW COMPARISON:  None. FINDINGS: There are 5 non rib-bearing lumbar type vertebrae. There is trace retrolisthesis of L3 on L4. No definite pars defect or other fracture is identified. Disc space narrowing is mild at L3-4 and L4-5 and moderate at L5-S1. There is mild endplate osteophytosis throughout the lumbar spine. Small calcifications in the pelvis likely represent phleboliths. IMPRESSION: Mild to moderate lumbar disc degeneration. No acute osseous abnormality. Electronically Signed   By: Eric Morris M.D.   On: 12/30/2019 15:51    Assessment and Plan:   Eric Morris is a 79 y.o. y/o male has been referred for unintentional weight loss.  Never had a GI evaluation.  Very low B12 levels noted.  HbA1c has been elevated.  Obviously at his age one  needs to be concerned about malignancy in the setting of unintentional weight loss.  I will proceed with EGD and colonoscopy within the next 3 to 4 weeks.  If negative will need CT scan of the chest abdomen and pelvis.  Agree  to continue with B12 shots.  Worsening of his glucose control could be contributing at least partly to the weight loss.  Also reminded him to take his COVID-19 vaccine at the has not had 1.  I have discussed alternative options, risks & benefits,  which include, but are not limited to, bleeding, infection, perforation,respiratory complication & drug reaction.  The patient agrees with this plan & written consent will be obtained.    Follow up in 6 weeks  Dr Eric Bellows MD,MRCP(U.K)

## 2020-01-22 ENCOUNTER — Telehealth: Payer: Self-pay | Admitting: Internal Medicine

## 2020-01-22 MED ORDER — PEG 3350-KCL-NABCB-NACL-NASULF 236 G PO SOLR: ORAL | 0 refills | Status: DC

## 2020-01-22 NOTE — Telephone Encounter (Signed)
Faxed blood thinner information request to [facility] for aspirin 5 days prior to procedure faxed on 01-22-20

## 2020-01-27 ENCOUNTER — Other Ambulatory Visit: Payer: Self-pay

## 2020-01-27 ENCOUNTER — Ambulatory Visit (INDEPENDENT_AMBULATORY_CARE_PROVIDER_SITE_OTHER): Payer: Medicare Other | Admitting: Internal Medicine

## 2020-01-27 ENCOUNTER — Encounter: Payer: Self-pay | Admitting: Internal Medicine

## 2020-01-27 VITALS — BP 110/76 | HR 54 | Temp 97.7°F | Ht 70.0 in | Wt 146.1 lb

## 2020-01-27 DIAGNOSIS — E1165 Type 2 diabetes mellitus with hyperglycemia: Secondary | ICD-10-CM

## 2020-01-27 DIAGNOSIS — Z1283 Encounter for screening for malignant neoplasm of skin: Secondary | ICD-10-CM

## 2020-01-27 DIAGNOSIS — L570 Actinic keratosis: Secondary | ICD-10-CM | POA: Diagnosis not present

## 2020-01-27 DIAGNOSIS — E538 Deficiency of other specified B group vitamins: Secondary | ICD-10-CM | POA: Insufficient documentation

## 2020-01-27 DIAGNOSIS — L720 Epidermal cyst: Secondary | ICD-10-CM

## 2020-01-27 DIAGNOSIS — R972 Elevated prostate specific antigen [PSA]: Secondary | ICD-10-CM | POA: Diagnosis not present

## 2020-01-27 DIAGNOSIS — N401 Enlarged prostate with lower urinary tract symptoms: Secondary | ICD-10-CM

## 2020-01-27 DIAGNOSIS — K624 Stenosis of anus and rectum: Secondary | ICD-10-CM

## 2020-01-27 MED ORDER — CYANOCOBALAMIN 1000 MCG/ML IJ SOLN
1000.0000 ug | INTRAMUSCULAR | Status: DC
  Administered 2020-01-27: 1000 ug via INTRAMUSCULAR

## 2020-01-27 MED ORDER — BLOOD GLUCOSE METER KIT: PACK | 0 refills | Status: AC

## 2020-01-27 NOTE — Addendum Note (Signed)
Addended by: Thressa Sheller on: 01/27/2020 10:15 AM   Modules accepted: Orders

## 2020-01-27 NOTE — Progress Notes (Addendum)
Chief Complaint  Patient presents with  . Follow-up   F/u with wife  1. DM 2 A1C 8/4 12/31/19 was eating reeses and drinking pepsi on metformin 500 and glipizide 2.5  2. B12 low will give 2nd shot today 3. Elevated psa need to get notes Dr. Rogers Blocker 4. AK and cyst of left upper forehead/temple   Review of Systems  Constitutional: Negative for weight loss.       Wt gain increased   HENT: Negative for hearing loss.   Eyes: Negative for blurred vision.  Respiratory: Negative for shortness of breath.   Cardiovascular: Negative for chest pain.  Gastrointestinal: Negative for abdominal pain.  Musculoskeletal: Negative for falls.  Skin: Negative for rash.  Neurological: Negative for headaches.  Psychiatric/Behavioral: Negative for depression.   Past Medical History:  Diagnosis Date  . Arthritis   . Diabetes mellitus without complication ([place])   . Pain    RIGHT SHOULDER   . Retinopathy 04/19/2018   No diabetic Retinopathy  . Stroke [facility])    TIA 2017  . TIA (transient ischemic attack)    Past Surgical History:  Procedure Laterality Date  . BACK SURGERY    . CATARACT EXTRACTION W/PHACO Right 08/22/2018   Procedure: CATARACT EXTRACTION PHACO AND INTRAOCULAR LENS PLACEMENT (IOC)-RIGHT;  Surgeon: Marchia Meiers, MD;  Location: [facility] ORS;  Service: Ophthalmology;  Laterality: Right;  [place] 02:18.0CDE 24.65Fluid Pack Lot # [id number]  . CATARACT EXTRACTION W/PHACO Left 12/05/2018   Procedure: CATARACT EXTRACTION PHACO AND INTRAOCULAR LENS PLACEMENT (IOC) LEFT, DIABETIC;  Surgeon: Marchia Meiers, MD;  Location: [facility] ORS;  Service: Ophthalmology;  Laterality: Left;  [place] 00:55.8 AP% 12.2 CDE 8.10 Fluid Pack Lot # [id number]  . HERNIA REPAIR     Family History  Problem Relation Age of Onset  . Cancer Mother        ?type  . Cancer Father        ?type  . Cancer Brother        ?GB cancer    Social History   Socioeconomic History  . Marital status: Married    Spouse name: Not on file  . Number  of children: Not on file  . Years of education: Not on file  . Highest education level: Not on file  Occupational History  . Not on file  Tobacco Use  . Smoking status: Former Smoker    Packs/day: 1.50    Years: 15.00    Pack years: 22.50    Types: Cigarettes    Quit date: 06/19/2005    Years since quitting: 14.6  . Smokeless tobacco: Current User    Types: Chew  Substance and Sexual Activity  . Alcohol use: Yes    Alcohol/week: 4.0 - 5.0 standard drinks    Types: 4 - 5 Cans of beer per week    Comment: OCCAS  . Drug use: Never  . Sexual activity: Not Currently    Partners: Female  Other Topics Concern  . 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 Difficulty of Paying Living Expenses:  Not hard at all  Food Insecurity: No Food Insecurity  . Worried About Charity fundraiser in the Last Year: Never true  . Ran Out of Food in the Last Year: Never true  Transportation Needs: No Transportation Needs  . Lack of Transportation (Medical): No  . Lack of Transportation (Non-Medical): No  Physical Activity: Insufficiently Active  . Days of Exercise per Week: 4 days  . Minutes of Exercise per Session: 20 min  Stress: No Stress Concern Present  . Feeling of Stress : Not at all  Social Connections: Unknown  . Frequency of Communication with Friends and Family: Not on file  . Frequency of Social Gatherings with Friends and Family: Not on file  . Attends Religious Services: Not on file  . Active Member of Clubs or Organizations: Not on file  . Attends Archivist Meetings: Not on file  . Marital Status: Married  Human resources officer Violence: Not At Risk  . Fear of Current or  Ex-Partner: No  . Emotionally Abused: No  . Physically Abused: No  . Sexually Abused: No   Current Meds  Medication Sig  . aspirin EC 325 MG EC tablet Take 1 tablet (325 mg total) by mouth daily.  Marland Kitchen glipiZIDE (GLUCOTROL XL) 2.5 MG 24 hr tablet Take 1 tablet (2.5 mg total) by mouth daily with breakfast.  . metFORMIN (GLUCOPHAGE) 500 MG tablet Take 1 tablet (500 mg total) by mouth 2 (two) times daily with a meal.  . polyethylene glycol (GOLYTELY) 236 g solution Drink 8 oz every 20-30 minutes until entire prep is finished  . pravastatin (PRAVACHOL) 20 MG tablet Take 1 tablet (20 mg total) by mouth at bedtime.  . [DISCONTINUED] gabapentin (NEURONTIN) 300 MG capsule Take 1 capsule (300 mg total) by mouth at bedtime.   No Known Allergies Recent Results (from the past 2160 hour(s))  Comprehensive metabolic panel     Status: Abnormal   Collection Time: 12/30/19 10:05 AM  Result Value Ref Range   Sodium 138 135 - 145 mEq/L   Potassium 4.9 3.5 - 5.1 mEq/L   Chloride 102 96 - 112 mEq/L   CO2 28 19 - 32 mEq/L   Glucose, Bld 178 (H) 70 - 99 mg/dL   BUN 25 (H) 6 - 23 mg/dL   Creatinine, Ser 1.33 0.40 - 1.50 mg/dL   Total Bilirubin 0.4 0.2 - 1.2 mg/dL   Alkaline Phosphatase 60 39 - 117 U/L   AST 12 0 - 37 U/L   ALT 10 0 - 53 U/L   Total Protein 6.9 6.0 - 8.3 g/dL   Albumin 4.2 3.5 - 5.2 g/dL   GFR 52.00 (L) >60.00 mL/min   Calcium 9.5 8.4 - 10.5 mg/dL  CBC with Differential/Platelet     Status: None   Collection Time: 12/30/19 10:05 AM  Result Value Ref Range   WBC 6.2 4.0 - 10.5 K/uL   RBC 4.49 4.22 - 5.81 Mil/uL   Hemoglobin 13.0 13.0 - 17.0 g/dL   HCT 40.1 39 - 52 %   MCV 89.3 78.0 - 100.0 fl   MCHC 32.4 30.0 - 36.0 g/dL   RDW 14.2 11.5 - 15.5 %   Platelets 339.0 150 - 400 K/uL   Neutrophils Relative % 57.6 43 - 77 %   Lymphocytes Relative 30.6 12 - 46 %   Monocytes Relative 7.9 3 - 12 %   Eosinophils Relative 2.6 0 - 5 %   Basophils Relative 1.3 0 - 3 %  Neutro Abs 3.6 1.4 -  7.7 K/uL   Lymphs Abs 1.9 0.7 - 4.0 K/uL   Monocytes Absolute 0.5 0 - 1 K/uL   Eosinophils Absolute 0.2 0 - 0 K/uL   Basophils Absolute 0.1 0 - 0 K/uL  Lipid panel     Status: Abnormal   Collection Time: 12/30/19 10:05 AM  Result Value Ref Range   Cholesterol 186 0 - 200 mg/dL    Comment: ATP III Classification       Desirable:  < 200 mg/dL               Borderline High:  200 - 239 mg/dL          High:  > = 240 mg/dL   Triglycerides 185.0 (H) 0 - 149 mg/dL    Comment: Normal:  <150 mg/dLBorderline High:  150 - 199 mg/dL   HDL 44.50 >39.00 mg/dL   VLDL 37.0 0.0 - 40.0 mg/dL   LDL Cholesterol 104 (H) 0 - 99 mg/dL   Total CHOL/HDL Ratio 4     Comment:                Men          Women1/2 Average Risk     3.4          3.3Average Risk          5.0          4.42X Average Risk          9.6          7.13X Average Risk          15.0          11.0                       NonHDL 141.16     Comment: NOTE:  Non-HDL goal should be 30 mg/dL higher than patient's LDL goal (i.e. LDL goal of < 70 mg/dL, would have non-HDL goal of < 100 mg/dL)  TSH     Status: None   Collection Time: 12/30/19 10:05 AM  Result Value Ref Range   TSH 2.53 0.35 - 4.50 uIU/mL  Urinalysis, Routine w reflex microscopic     Status: Abnormal   Collection Time: 12/30/19 10:05 AM  Result Value Ref Range   Color, Urine YELLOW YELLOW   APPearance CLEAR CLEAR   Specific Gravity, Urine 1.025 1.001 - 1.03   pH < OR = 5.0 5.0 - 8.0   Glucose, UA 1+ (A) NEGATIVE   Bilirubin Urine NEGATIVE NEGATIVE   Ketones, ur NEGATIVE NEGATIVE   Hgb urine dipstick NEGATIVE NEGATIVE   Protein, ur NEGATIVE NEGATIVE   Nitrite NEGATIVE NEGATIVE   Leukocytes,Ua NEGATIVE NEGATIVE  Hemoglobin A1c     Status: Abnormal   Collection Time: 12/30/19 10:05 AM  Result Value Ref Range   Hgb A1c MFr Bld 8.4 (H) 4.6 - 6.5 %    Comment: Glycemic Control Guidelines for People with Diabetes:Non Diabetic:  <6%Goal of Therapy: <7%Additional Action Suggested:  >8%     Microalbumin / creatinine urine ratio     Status: None   Collection Time: 12/30/19 10:05 AM  Result Value Ref Range   Creatinine, Urine 211 20 - 320 mg/dL   Microalb, Ur 1.0 mg/dL    Comment: Reference Range Not established    Microalb Creat Ratio 5 <30 mcg/mg creat    Comment: . The ADA defines abnormalities in albumin excretion  as follows: Marland Kitchen Category         Result (mcg/mg creatinine) . Normal                    <30 Microalbuminuria         30-299  Clinical albuminuria   > OR = 300 . The ADA recommends that at least two of three specimens collected within a 3-6 month period be abnormal before considering a patient to be within a diagnostic category.   PSA     Status: Abnormal   Collection Time: 12/30/19 10:05 AM  Result Value Ref Range   PSA 5.21 (H) 0.10 - 4.00 ng/mL    Comment: Test performed using Access Hybritech PSA Assay, a parmagnetic partical, chemiluminecent immunoassay.  Vitamin D (25 hydroxy)     Status: None   Collection Time: 12/30/19 10:05 AM  Result Value Ref Range   VITD 36.47 30.00 - 100.00 ng/mL  B12     Status: Abnormal   Collection Time: 12/30/19 10:05 AM  Result Value Ref Range   Vitamin B-12 162 (L) 211 - 911 pg/mL  Urine Culture     Status: None   Collection Time: 12/30/19 10:10 AM   Specimen: Urine  Result Value Ref Range   MICRO NUMBER: [id number]    SPECIMEN QUALITY: Adequate    Sample Source NOT GIVEN    STATUS: FINAL    Result: No Growth   Hepatitis C antibody     Status: None   Collection Time: 12/30/19 11:20 AM  Result Value Ref Range   Hepatitis C Ab NON-REACTIVE NON-REACTI   SIGNAL TO CUT-OFF 0.06 <1.00    Comment: . HCV antibody was non-reactive. There is no laboratory  evidence of HCV infection. . In most cases, no further action is required. However, if recent HCV exposure is suspected, a test for HCV RNA (test code 19538) is suggested. . For additional information please refer  to http://education.questdiagnostics.com/faq/FAQ22v1 (This link is being provided for informational/ educational purposes only.) .    Objective  Body mass index is 20.97 kg/m. Wt Readings from Last 3 Encounters:  01/27/20 146 lb 1.9 oz (66.3 kg)  01/07/20 146 lb (66.2 kg)  12/30/19 143 lb 12.8 oz (65.2 kg)   Temp Readings from Last 3 Encounters:  01/27/20 97.7 F (36.5 C) (Oral)  01/07/20 98.1 F (36.7 C)  12/30/19 98.3 F (36.8 C) (Oral)   BP Readings from Last 3 Encounters:  01/27/20 110/76  01/07/20 137/82  12/30/19 140/82   Pulse Readings from Last 3 Encounters:  01/27/20 (!) 54  01/07/20 68  12/30/19 60    Physical Exam Vitals and nursing note reviewed.  Constitutional:      Appearance: Normal appearance. He is well-developed and well-groomed.  HENT:     Head: Normocephalic and atraumatic.  Eyes:     Conjunctiva/sclera: Conjunctivae normal.     Pupils: Pupils are equal, round, and reactive to light.  Cardiovascular:     Rate and Rhythm: Normal rate and regular rhythm.     Heart sounds: Normal heart sounds. No murmur heard.   Pulmonary:     Effort: Pulmonary effort is normal.     Breath sounds: Normal breath sounds.  Skin:    General: Skin is warm and dry.       Neurological:     General: No focal deficit present.     Mental Status: He is alert and oriented to person, place, and time. Mental status is at  baseline.     Gait: Gait normal.  Psychiatric:        Attention and Perception: Attention and perception normal.        Mood and Affect: Mood and affect normal.        Speech: Speech normal.        Behavior: Behavior normal. Behavior is cooperative.        Thought Content: Thought content normal.        Cognition and Memory: Cognition and memory normal.        Judgment: Judgment normal.     Assessment  Plan  Type 2 diabetes mellitus with hyperglycemia, without long-term current use of insulin (HCC) - Plan: Comprehensive metabolic panel, Lipid  panel, Hemoglobin A1c, blood glucose meter kit and supplies Cont meds  Elevated PSA ROI Dr. Rogers Blocker   B12 deficiency B12 given today   Actinic keratosis Epidermal cyst of face   HM Flu shot utd pna 23 had 04/14/16 Consider Tdap, shingrix, prevnar  covid 2/2 pfizer   cologuard 08/04/19 + sch colonoscopy Elevated PSA 5.21 elevated saw Dr. Rogers Blocker 01/16/20 exosome study and consider MRI/bx in future  Skin referred dermatology Ak changes and ? nmsc to left arm esp.  TFC referred for clavi to feet Former smoker since age 29 y.o max 3 ppd quit in 2009 or 2011. No FH lung cancer  -consider calc CT risk score to see if qualifies CT chest  Get records [facility] 02/12/20  Hep C neg   Weight loss per GI 04/05/20  no gross lesions on CT to explain weight loss. Close follow up with Dr Terese Door, Nino Glow, MD , if worsens may need to see oncology , ?? PET scan . Some lung nodules seen which would require follow up with Dr McLean-Scocuzza, Nino Glow, MD   Dr Jonathon Bellows MD,MRCP [facility])  Gastroenterology/Hepatology  Pager: [phone]    Provider: Dr. Olivia Mackie McLean-Scocuzza-Internal Medicine

## 2020-01-27 NOTE — Patient Instructions (Addendum)
B12 1000 mcg daily or B12 injections every 30 days    Diabetes Mellitus and Nutrition, Adult When you have diabetes (diabetes mellitus), it is very important to have healthy eating habits because your blood sugar (glucose) levels are greatly affected by what you eat and drink. Eating healthy foods in the appropriate amounts, at about the same times every day, can help you:  Control your blood glucose.  Lower your risk of heart disease.  Improve your blood pressure.  Reach or maintain a healthy weight. Every person with diabetes is different, and each person has different needs for a meal plan. Your health care provider may recommend that you work with a diet and nutrition specialist (dietitian) to make a meal plan that is best for you. Your meal plan may vary depending on factors such as:  The calories you need.  The medicines you take.  Your weight.  Your blood glucose, blood pressure, and cholesterol levels.  Your activity level.  Other health conditions you have, such as heart or kidney disease. How do carbohydrates affect me? Carbohydrates, also called carbs, affect your blood glucose level more than any other type of food. Eating carbs naturally raises the amount of glucose in your blood. Carb counting is a method for keeping track of how many carbs you eat. Counting carbs is important to keep your blood glucose at a healthy level, especially if you use insulin or take certain oral diabetes medicines. It is important to know how many carbs you can safely have in each meal. This is different for every person. Your dietitian can help you calculate how many carbs you should have at each meal and for each snack. Foods that contain carbs include:  Bread, cereal, rice, pasta, and crackers.  Potatoes and corn.  Peas, beans, and lentils.  Milk and yogurt.  Fruit and juice.  Desserts, such as cakes, cookies, ice cream, and candy. How does alcohol affect me? Alcohol can cause a  sudden decrease in blood glucose (hypoglycemia), especially if you use insulin or take certain oral diabetes medicines. Hypoglycemia can be a life-threatening condition. Symptoms of hypoglycemia (sleepiness, dizziness, and confusion) are similar to symptoms of having too much alcohol. If your health care provider says that alcohol is safe for you, follow these guidelines:  Limit alcohol intake to no more than 1 drink per day for nonpregnant women and 2 drinks per day for men. One drink equals 12 oz of beer, 5 oz of wine, or 1 oz of hard liquor.  Do not drink on an empty stomach.  Keep yourself hydrated with water, diet soda, or unsweetened iced tea.  Keep in mind that regular soda, juice, and other mixers may contain a lot of sugar and must be counted as carbs. What are tips for following this plan?  Reading food labels  Start by checking the serving size on the "Nutrition Facts" label of packaged foods and drinks. The amount of calories, carbs, fats, and other nutrients listed on the label is based on one serving of the item. Many items contain more than one serving per package.  Check the total grams (g) of carbs in one serving. You can calculate the number of servings of carbs in one serving by dividing the total carbs by 15. For example, if a food has 30 g of total carbs, it would be equal to 2 servings of carbs.  Check the number of grams (g) of saturated and trans fats in one serving. Choose foods that  have low or no amount of these fats.  Check the number of milligrams (mg) of salt (sodium) in one serving. Most people should limit total sodium intake to less than 2,300 mg per day.  Always check the nutrition information of foods labeled as "low-fat" or "nonfat". These foods may be higher in added sugar or refined carbs and should be avoided.  Talk to your dietitian to identify your daily goals for nutrients listed on the label. Shopping  Avoid buying canned, premade, or processed  foods. These foods tend to be high in fat, sodium, and added sugar.  Shop around the outside edge of the grocery store. This includes fresh fruits and vegetables, bulk grains, fresh meats, and fresh dairy. Cooking  Use low-heat cooking methods, such as baking, instead of high-heat cooking methods like deep frying.  Cook using healthy oils, such as olive, canola, or sunflower oil.  Avoid cooking with butter, cream, or high-fat meats. Meal planning  Eat meals and snacks regularly, preferably at the same times every day. Avoid going long periods of time without eating.  Eat foods high in fiber, such as fresh fruits, vegetables, beans, and whole grains. Talk to your dietitian about how many servings of carbs you can eat at each meal.  Eat 4-6 ounces (oz) of lean protein each day, such as lean meat, chicken, fish, eggs, or tofu. One oz of lean protein is equal to: ? 1 oz of meat, chicken, or fish. ? 1 egg. ?  cup of tofu.  Eat some foods each day that contain healthy fats, such as avocado, nuts, seeds, and fish. Lifestyle  Check your blood glucose regularly.  Exercise regularly as told by your health care provider. This may include: ? 150 minutes of moderate-intensity or vigorous-intensity exercise each week. This could be brisk walking, biking, or water aerobics. ? Stretching and doing strength exercises, such as yoga or weightlifting, at least 2 times a week.  Take medicines as told by your health care provider.  Do not use any products that contain nicotine or tobacco, such as cigarettes and e-cigarettes. If you need help quitting, ask your health care provider.  Work with a Social worker or diabetes educator to identify strategies to manage stress and any emotional and social challenges. Questions to ask a health care provider  Do I need to meet with a diabetes educator?  Do I need to meet with a dietitian?  What number can I call if I have questions?  When are the best times  to check my blood glucose? Where to find more information:  American Diabetes Association: diabetes.org  Academy of Nutrition and Dietetics: www.eatright.[employer] of Diabetes and Digestive and Kidney Diseases (NIH): [employer] Summary  A healthy meal plan will help you control your blood glucose and maintain a healthy lifestyle.  Working with a diet and nutrition specialist (dietitian) can help you make a meal plan that is best for you.  Keep in mind that carbohydrates (carbs) and alcohol have immediate effects on your blood glucose levels. It is important to count carbs and to use alcohol carefully. This information is not intended to replace advice given to you by your health care provider. Make sure you discuss any questions you have with your health care provider. Document Revised: 05/18/2017 Document Reviewed: 07/10/2016 Elsevier Patient Education  2020 [employer].

## 2020-01-28 DIAGNOSIS — K624 Stenosis of anus and rectum: Secondary | ICD-10-CM | POA: Insufficient documentation

## 2020-02-10 ENCOUNTER — Other Ambulatory Visit: Payer: Self-pay

## 2020-02-10 ENCOUNTER — Other Ambulatory Visit
Admission: RE | Admit: 2020-02-10 | Discharge: 2020-02-10 | Disposition: A | Discharge status: Home / Self Care | Payer: Medicare Other | Source: Ambulatory Visit | Attending: Gastroenterology | Admitting: Gastroenterology

## 2020-02-10 DIAGNOSIS — Z01812 Encounter for preprocedural laboratory examination: Secondary | ICD-10-CM | POA: Diagnosis not present

## 2020-02-10 DIAGNOSIS — Z20822 Contact with and (suspected) exposure to covid-19: Secondary | ICD-10-CM | POA: Insufficient documentation

## 2020-02-11 ENCOUNTER — Encounter: Payer: Self-pay | Admitting: Gastroenterology

## 2020-02-11 LAB — SARS CORONAVIRUS 2 (TAT 6-24 HRS): SARS Coronavirus 2: NEGATIVE

## 2020-02-12 ENCOUNTER — Ambulatory Visit
Admission: RE | Admit: 2020-02-12 | Discharge: 2020-02-12 | Disposition: A | Discharge status: Home / Self Care | Payer: Medicare Other | Attending: Gastroenterology | Admitting: Gastroenterology

## 2020-02-12 ENCOUNTER — Encounter
Admission: RE | Disposition: A | Discharge status: Home / Self Care | Payer: Self-pay | Source: Home / Self Care | Attending: Gastroenterology

## 2020-02-12 ENCOUNTER — Ambulatory Visit: Payer: Medicare Other | Admitting: Anesthesiology

## 2020-02-12 ENCOUNTER — Encounter: Payer: Self-pay | Admitting: Gastroenterology

## 2020-02-12 DIAGNOSIS — Z8673 Personal history of transient ischemic attack (TIA), and cerebral infarction without residual deficits: Secondary | ICD-10-CM | POA: Insufficient documentation

## 2020-02-12 DIAGNOSIS — Z7984 Long term (current) use of oral hypoglycemic drugs: Secondary | ICD-10-CM | POA: Diagnosis not present

## 2020-02-12 DIAGNOSIS — D124 Benign neoplasm of descending colon: Secondary | ICD-10-CM | POA: Diagnosis not present

## 2020-02-12 DIAGNOSIS — R634 Abnormal weight loss: Secondary | ICD-10-CM | POA: Diagnosis not present

## 2020-02-12 DIAGNOSIS — Z7982 Long term (current) use of aspirin: Secondary | ICD-10-CM | POA: Insufficient documentation

## 2020-02-12 DIAGNOSIS — K635 Polyp of colon: Secondary | ICD-10-CM | POA: Diagnosis not present

## 2020-02-12 DIAGNOSIS — H3500 Unspecified background retinopathy: Secondary | ICD-10-CM | POA: Diagnosis not present

## 2020-02-12 DIAGNOSIS — E119 Type 2 diabetes mellitus without complications: Secondary | ICD-10-CM | POA: Insufficient documentation

## 2020-02-12 DIAGNOSIS — Z87891 Personal history of nicotine dependence: Secondary | ICD-10-CM | POA: Insufficient documentation

## 2020-02-12 DIAGNOSIS — M1990 Unspecified osteoarthritis, unspecified site: Secondary | ICD-10-CM | POA: Diagnosis not present

## 2020-02-12 DIAGNOSIS — Z6821 Body mass index (BMI) 21.0-21.9, adult: Secondary | ICD-10-CM | POA: Insufficient documentation

## 2020-02-12 DIAGNOSIS — D125 Benign neoplasm of sigmoid colon: Secondary | ICD-10-CM | POA: Insufficient documentation

## 2020-02-12 DIAGNOSIS — E1165 Type 2 diabetes mellitus with hyperglycemia: Secondary | ICD-10-CM | POA: Diagnosis not present

## 2020-02-12 DIAGNOSIS — D128 Benign neoplasm of rectum: Secondary | ICD-10-CM | POA: Diagnosis not present

## 2020-02-12 DIAGNOSIS — Z79899 Other long term (current) drug therapy: Secondary | ICD-10-CM | POA: Insufficient documentation

## 2020-02-12 DIAGNOSIS — D122 Benign neoplasm of ascending colon: Secondary | ICD-10-CM | POA: Insufficient documentation

## 2020-02-12 DIAGNOSIS — I10 Essential (primary) hypertension: Secondary | ICD-10-CM | POA: Diagnosis not present

## 2020-02-12 HISTORY — PX: ESOPHAGOGASTRODUODENOSCOPY (EGD) WITH PROPOFOL: SHX5813

## 2020-02-12 HISTORY — PX: COLONOSCOPY WITH PROPOFOL: SHX5780

## 2020-02-12 LAB — GLUCOSE, CAPILLARY: Glucose-Capillary: 132 mg/dL — ABNORMAL HIGH (ref 70–99)

## 2020-02-12 SURGERY — COLONOSCOPY WITH PROPOFOL
Anesthesia: General

## 2020-02-12 MED ORDER — PROPOFOL 500 MG/50ML IV EMUL
INTRAVENOUS | Status: DC | PRN
  Administered 2020-02-12: 175 ug/kg/min via INTRAVENOUS

## 2020-02-12 MED ORDER — PROPOFOL 10 MG/ML IV BOLUS
INTRAVENOUS | Status: DC | PRN
  Administered 2020-02-12: 50 mg via INTRAVENOUS

## 2020-02-12 MED ORDER — PROPOFOL 500 MG/50ML IV EMUL
INTRAVENOUS | Status: AC
  Filled 2020-02-12: qty 50

## 2020-02-12 MED ORDER — SODIUM CHLORIDE 0.9 % IV SOLN: INTRAVENOUS | Status: DC

## 2020-02-12 MED ORDER — LIDOCAINE HCL (PF) 1 % IJ SOLN
INTRAMUSCULAR | Status: AC
  Filled 2020-02-12: qty 2

## 2020-02-12 NOTE — Op Note (Signed)
[facility] Patient Name: Eric Morris Procedure Date: 02/12/2020 10:05 AM MRN: [id number] Account #: [id number] Date of Birth: 03-Mar-1941 Admit Type: Outpatient Age: 79 Room: [facility] ENDO ROOM 4 Gender: Male Note Status: Finalized Procedure:             Colonoscopy Indications:           Weight loss Providers:             Jonathon Bellows MD, MD Referring MD:          Nino Glow Mclean-Scocuzza MD, MD (Referring MD) Medicines:             Monitored Anesthesia Care Complications:         No immediate complications. Procedure:             Pre-Anesthesia Assessment:                        - Prior to the procedure, a History and Physical was                         performed, and patient medications, allergies and                         sensitivities were reviewed. The patient's tolerance                         of previous anesthesia was reviewed.                        - The risks and benefits of the procedure and the                         sedation options and risks were discussed with the                         patient. All questions were answered and informed                         consent was obtained.                        - ASA Grade Assessment: II - A patient with mild                         systemic disease.                        After obtaining informed consent, the colonoscope was                         passed under direct vision. Throughout the procedure,                         the patient's blood pressure, pulse, and oxygen                         saturations were monitored continuously. The                         Colonoscope was introduced through the anus and  advanced to the the cecum, identified by the                         appendiceal orifice. The colonoscopy was performed                         with ease. The patient tolerated the procedure well.                         The quality of the bowel  preparation was good. Findings:      The perianal and digital rectal examinations were normal.      Three sessile polyps were found in the rectum, descending colon and       ascending colon. The polyps were 5 to 8 mm in size. These polyps were       removed with a cold snare. Resection and retrieval were complete.      Two sessile polyps were found in the sigmoid colon. The polyps were 5 to       7 mm in size. These polyps were removed with a cold snare. Resection and       retrieval were complete.      The exam was otherwise without abnormality on direct and retroflexion       views. Impression:            - Three 5 to 8 mm polyps in the rectum, in the                         descending colon and in the ascending colon, removed                         with a cold snare. Resected and retrieved.                        - Two 5 to 7 mm polyps in the sigmoid colon, removed                         with a cold snare. Resected and retrieved.                        - The examination was otherwise normal on direct and                         retroflexion views. Recommendation:        - Discharge patient to home (with escort).                        -                        - Continue present medications.                        - Await pathology results.                        - Repeat colonoscopy for surveillance based on                         pathology results.                        -  Perform a CT scan (computed tomography) of chest                         with contrast, abdomen with contrast and pelvis with                         contrast in 2 weeks. Procedure Code(s):     --- Professional ---                        [id number], Colonoscopy, flexible; with removal of                         tumor(s), polyp(s), or other lesion(s) by snare                         technique Diagnosis Code(s):     --- Professional ---                        K62.1, Rectal polyp                        K63.5, Polyp  of colon                        R63.4, Abnormal weight loss CPT copyright 2019 American Medical Association. All rights reserved. The codes documented in this report are preliminary and upon coder review may  be revised to meet current compliance requirements. Jonathon Bellows, MD Jonathon Bellows MD, MD 02/12/2020 10:44:44 AM This report has been signed electronically. Number of Addenda: 0 Note Initiated On: 02/12/2020 10:05 AM Scope Withdrawal Time: 0 hours 17 minutes 21 seconds  Total Procedure Duration: 0 hours 19 minutes 3 seconds  Estimated Blood Loss:  Estimated blood loss: none.      [facility]

## 2020-02-12 NOTE — Transfer of Care (Signed)
Immediate Anesthesia Transfer of Care Note  Patient: WEAVER TWEED  Procedure(s) Performed: COLONOSCOPY WITH PROPOFOL (N/A ) ESOPHAGOGASTRODUODENOSCOPY (EGD) WITH PROPOFOL (N/A )  Patient Location: PACU  Anesthesia Type:General  Level of Consciousness: awake and alert   Airway & Oxygen Therapy: Patient Spontanous Breathing and Patient connected to nasal cannula oxygen  Post-op Assessment: Report given to RN and Post -op Vital signs reviewed and stable  Post vital signs: Reviewed and stable  Last Vitals:  Vitals Value Taken Time  BP 112/71 02/12/20 1047  Temp 36.1 C 02/12/20 1047  Pulse 72 02/12/20 1048  Resp 14 02/12/20 1048  SpO2 100 % 02/12/20 1048  Vitals shown include unvalidated device data.  Last Pain:  Vitals:   02/12/20 1047  TempSrc: Temporal  PainSc: Asleep         Complications: No complications documented.

## 2020-02-12 NOTE — Op Note (Signed)
[facility] Patient Name: Eric Morris Procedure Date: 02/12/2020 10:00 AM MRN: [id number] Account #: [id number] Date of Birth: 1940-12-23 Admit Type: Outpatient Age: 79 Room: [facility] ENDO ROOM 4 Gender: Male Note Status: Finalized Procedure:             Upper GI endoscopy Indications:           Weight loss Providers:             Jonathon Bellows MD, MD Referring MD:          Nino Glow Mclean-Scocuzza MD, MD (Referring MD) Medicines:             Monitored Anesthesia Care Complications:         No immediate complications. Procedure:             Pre-Anesthesia Assessment:                        - Prior to the procedure, a History and Physical was                         performed, and patient medications, allergies and                         sensitivities were reviewed. The patient's tolerance                         of previous anesthesia was reviewed.                        - The risks and benefits of the procedure and the                         sedation options and risks were discussed with the                         patient. All questions were answered and informed                         consent was obtained.                        - ASA Grade Assessment: II - A patient with mild                         systemic disease.                        After obtaining informed consent, the endoscope was                         passed under direct vision. Throughout the procedure,                         the patient's blood pressure, pulse, and oxygen                         saturations were monitored continuously. The Endoscope                         was introduced through the mouth,  and advanced to the                         third part of duodenum. The upper GI endoscopy was                         accomplished with ease. The patient tolerated the                         procedure well. Findings:      The esophagus was normal.      The stomach was  normal.      The examined duodenum was normal. Impression:            - Normal esophagus.                        - Normal stomach.                        - Normal examined duodenum.                        - No specimens collected. Recommendation:        - Perform a colonoscopy today. Procedure Code(s):     --- Professional ---                        [phone], Esophagogastroduodenoscopy, flexible,                         transoral; diagnostic, including collection of                         specimen(s) by brushing or washing, when performed                         (separate procedure) Diagnosis Code(s):     --- Professional ---                        R63.4, Abnormal weight loss CPT copyright 2019 American Medical Association. All rights reserved. The codes documented in this report are preliminary and upon coder review may  be revised to meet current compliance requirements. Jonathon Bellows, MD Jonathon Bellows MD, MD 02/12/2020 10:20:31 AM This report has been signed electronically. Number of Addenda: 0 Note Initiated On: 02/12/2020 10:00 AM Estimated Blood Loss:  Estimated blood loss: none.      [facility]

## 2020-02-12 NOTE — H&P (Signed)
Jonathon Bellows, MD [address], [place], [place], [place], [address] [address], [place], [place], [place], [address] Phone: [phone]  Fax: [phone]  Primary Care Physician:  McLean-Scocuzza, Nino Glow, MD   Pre-Procedure History & Physical: HPI:  DOMNIQUE VANTINE is a 79 y.o. male is here for an endoscopy and colonoscopy    Past Medical History:  Diagnosis Date  . Arthritis   . Diabetes mellitus without complication ([place])   . Pain    RIGHT SHOULDER   . Retinopathy 04/19/2018   No diabetic Retinopathy  . Stroke [facility])    TIA 2017  . TIA (transient ischemic attack)     Past Surgical History:  Procedure Laterality Date  . BACK SURGERY    . CATARACT EXTRACTION W/PHACO Right 08/22/2018   Procedure: CATARACT EXTRACTION PHACO AND INTRAOCULAR LENS PLACEMENT (IOC)-RIGHT;  Surgeon: Marchia Meiers, MD;  Location: [facility] ORS;  Service: Ophthalmology;  Laterality: Right;  [place] 02:18.0CDE 24.65Fluid Pack Lot # [id number]  . CATARACT EXTRACTION W/PHACO Left 12/05/2018   Procedure: CATARACT EXTRACTION PHACO AND INTRAOCULAR LENS PLACEMENT (IOC) LEFT, DIABETIC;  Surgeon: Marchia Meiers, MD;  Location: [facility] ORS;  Service: Ophthalmology;  Laterality: Left;  [place] 00:55.8 AP% 12.2 CDE 8.10 Fluid Pack Lot # [id number]  . HERNIA REPAIR      Prior to Admission medications   Medication Sig Start Date End Date Taking? Authorizing Provider  metFORMIN (GLUCOPHAGE) 500 MG tablet Take 1 tablet (500 mg total) by mouth 2 (two) times daily with a meal. 06/24/19  Yes Leone Haven, MD  aspirin EC 325 MG EC tablet Take 1 tablet (325 mg total) by mouth daily. 04/15/16   Nicholes Mango, MD  blood glucose meter kit and supplies Dispense based on patient and insurance preference. Use daily as directed. (FOR ICD-10 E10.9, E11.9). 1 year supply of lancets and strips 01/27/20   McLean-Scocuzza, Nino Glow, MD  glipiZIDE (GLUCOTROL XL) 2.5 MG 24 hr tablet Take 1 tablet (2.5 mg total) by mouth daily with breakfast.  12/31/19   McLean-Scocuzza, Nino Glow, MD  polyethylene glycol (GOLYTELY) 236 g solution Drink 8 oz every 20-30 minutes until entire prep is finished 01/22/20   Jonathon Bellows, MD  pravastatin (PRAVACHOL) 20 MG tablet Take 1 tablet (20 mg total) by mouth at bedtime. 12/31/19   McLean-Scocuzza, Nino Glow, MD    Allergies as of 01/22/2020  . (No Known Allergies)    Family History  Problem Relation Age of Onset  . Cancer Mother        ?type  . Cancer Father        ?type  . Cancer Brother        ?GB cancer     Social History   Socioeconomic History  . Marital status: Married    Spouse name: Not on file  . Number of children: Not on file  . Years of education: Not on file  . Highest education level: Not on file  Occupational History  . Not on file  Tobacco Use  . Smoking status: Former Smoker    Packs/day: 1.50    Years: 15.00    Pack years: 22.50    Types: Cigarettes    Quit date: 06/19/2005    Years since quitting: 14.6  . Smokeless tobacco: Current User    Types: Chew  Vaping Use  . Vaping Use: Never used  Substance and Sexual Activity  . Alcohol use: Yes    Alcohol/week: 4.0 - 5.0 standard  drinks    Types: 4 - 5 Cans of beer per week    Comment: OCCAS  . Drug use: Never  . Sexual activity: Not Currently    Partners: Female  Other Topics Concern  . 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 Difficulty of Paying Living Expenses: Not hard at all  Food Insecurity: No Food Insecurity  . Worried About Charity fundraiser in the Last Year: Never true  . Ran Out of Food in the Last Year: Never true  Transportation Needs: No Transportation Needs  . Lack of Transportation (Medical):  No  . Lack of Transportation (Non-Medical): No  Physical Activity: Insufficiently Active  . Days of Exercise per Week: 4 days  . Minutes of Exercise per Session: 20 min  Stress: No Stress Concern Present  . Feeling of Stress : Not at all  Social Connections: Unknown  . Frequency of Communication with Friends and Family: Not on file  . Frequency of Social Gatherings with Friends and Family: Not on file  . Attends Religious Services: Not on file  . Active Member of Clubs or Organizations: Not on file  . Attends Archivist Meetings: Not on file  . Marital Status: Married  Human resources officer Violence: Not At Risk  . Fear of Current or Ex-Partner: No  . Emotionally Abused: No  . Physically Abused: No  . Sexually Abused: No    Review of Systems: See HPI, otherwise negative ROS  Physical Exam: BP (!) 145/71   Pulse (!) 54   Temp (!) 96.6 F (35.9 C) (Tympanic)   Resp 18   Ht '5\' 10"'  (1.778 m)   Wt 68.5 kg   SpO2 100%   BMI 21.67 kg/m  General:   Alert,  pleasant and cooperative in NAD Head:  Normocephalic and atraumatic. Neck:  Supple; no masses or thyromegaly. Lungs:  Clear throughout to auscultation, normal respiratory effort.    Heart:  +S1, +S2, Regular rate and rhythm, No edema. Abdomen:  Soft, nontender and nondistended. Normal bowel sounds, without guarding, and without rebound.   Neurologic:  Alert and  oriented x4;  grossly normal neurologically.  Impression/Plan: DAGEN BEEVERS is here for an endoscopy and colonoscopy  to be performed for  evaluation of unintentional weight loss    Risks, benefits, limitations, and alternatives regarding endoscopy have been reviewed with the patient.  Questions have been answered.  All parties agreeable.   Jonathon Bellows, MD  02/12/2020, 10:01 AM

## 2020-02-12 NOTE — Anesthesia Preprocedure Evaluation (Signed)
Anesthesia Evaluation  Patient identified by MRN, date of birth, ID band Patient awake    Reviewed: Allergy & Precautions, H&P , NPO status , Patient's Chart, lab work & pertinent test results  History of Anesthesia Complications Negative for: history of anesthetic complications  Airway Mallampati: II       Dental  (+) Teeth Intact   Pulmonary neg COPD, former smoker,           Cardiovascular hypertension, (-) angina+ Peripheral Vascular Disease  (-) Past MI and (-) Cardiac Stents (-) dysrhythmias      Neuro/Psych TIA Neuromuscular disease CVA negative psych ROS   GI/Hepatic negative GI ROS, Neg liver ROS,   Endo/Other  diabetes  Renal/GU      Musculoskeletal  (+) Arthritis , Osteoarthritis,    Abdominal   Peds negative pediatric ROS (+)  Hematology negative hematology ROS (+)   Anesthesia Other Findings Past Medical History: No date: Arthritis No date: Diabetes mellitus without complication ([place]) No date: Pain     Comment:  RIGHT SHOULDER  04/19/2018: Retinopathy     Comment:  No diabetic Retinopathy No date: Stroke [facility])     Comment:  TIA 2017  Past Surgical History: No date: BACK SURGERY No date: HERNIA REPAIR  BMI    Body Mass Index:  20.81 kg/m      Reproductive/Obstetrics negative OB ROS                             Anesthesia Physical  Anesthesia Plan  ASA: III  Anesthesia Plan: General   Post-op Pain Management:    Induction:   PONV Risk Score and Plan: TIVA and Propofol infusion  Airway Management Planned: Nasal Cannula  Additional Equipment:   Intra-op Plan:   Post-operative Plan:   Informed Consent: I have reviewed the patients History and Physical, chart, labs and discussed the procedure including the risks, benefits and alternatives for the proposed anesthesia with the patient or authorized representative who has indicated his/her understanding  and acceptance.       Plan Discussed with: Anesthesiologist and CRNA  Anesthesia Plan Comments:         Anesthesia Quick Evaluation

## 2020-02-13 ENCOUNTER — Encounter: Payer: Self-pay | Admitting: Gastroenterology

## 2020-02-13 LAB — SURGICAL PATHOLOGY

## 2020-02-13 NOTE — Anesthesia Postprocedure Evaluation (Signed)
Anesthesia Post Note  Patient: Eric Morris  Procedure(s) Performed: COLONOSCOPY WITH PROPOFOL (N/A ) ESOPHAGOGASTRODUODENOSCOPY (EGD) WITH PROPOFOL (N/A )  Patient location during evaluation: Endoscopy Anesthesia Type: General Level of consciousness: awake and alert and oriented Pain management: pain level controlled Vital Signs Assessment: post-procedure vital signs reviewed and stable Respiratory status: spontaneous breathing Cardiovascular status: blood pressure returned to baseline Anesthetic complications: no   No complications documented.   Last Vitals:  Vitals:   02/12/20 1047 02/12/20 1117  BP: 112/71 (!) 145/80  Pulse: 73 (!) 54  Resp: (!) 22 13  Temp: (!) 36.1 C   SpO2: 100% 100%    Last Pain:  Vitals:   02/12/20 1117  TempSrc:   PainSc: 0-No pain                 Junell Cullifer

## 2020-02-17 ENCOUNTER — Encounter: Payer: Self-pay | Admitting: Gastroenterology

## 2020-02-18 ENCOUNTER — Other Ambulatory Visit: Payer: Self-pay

## 2020-02-18 ENCOUNTER — Ambulatory Visit (INDEPENDENT_AMBULATORY_CARE_PROVIDER_SITE_OTHER): Payer: Medicare Other | Admitting: Dermatology

## 2020-02-18 DIAGNOSIS — L820 Inflamed seborrheic keratosis: Secondary | ICD-10-CM

## 2020-02-18 DIAGNOSIS — L821 Other seborrheic keratosis: Secondary | ICD-10-CM

## 2020-02-18 DIAGNOSIS — Z1283 Encounter for screening for malignant neoplasm of skin: Secondary | ICD-10-CM | POA: Diagnosis not present

## 2020-02-18 DIAGNOSIS — L720 Epidermal cyst: Secondary | ICD-10-CM | POA: Diagnosis not present

## 2020-02-18 DIAGNOSIS — L814 Other melanin hyperpigmentation: Secondary | ICD-10-CM

## 2020-02-18 DIAGNOSIS — D1800 Hemangioma unspecified site: Secondary | ICD-10-CM

## 2020-02-18 DIAGNOSIS — L578 Other skin changes due to chronic exposure to nonionizing radiation: Secondary | ICD-10-CM

## 2020-02-18 DIAGNOSIS — D229 Melanocytic nevi, unspecified: Secondary | ICD-10-CM

## 2020-02-18 DIAGNOSIS — D692 Other nonthrombocytopenic purpura: Secondary | ICD-10-CM

## 2020-02-18 NOTE — Progress Notes (Signed)
   Follow-Up Visit   Subjective  Eric Morris is a 79 y.o. male who presents for the following: Cyst (left upper forehead x ~10 years, growing), Growth (left upper forehead), and UBSE. He has a brown spot on his left eye, present for maybe 6 months but is not sure. He mentioned he has had cataract surgery in both eyes.   The following portions of the chart were reviewed this encounter and updated as appropriate:      Review of Systems:  No other skin or systemic complaints except as noted in HPI or Assessment and Plan.  Objective  Well appearing patient in no apparent distress; mood and affect are within normal limits.  All skin waist up examined.  Objective  L upper forehead: 1.5cm bluish firm sq nodule  Objective  Forehead (4): Erythematous keratotic stuck-on papules   Objective  Left Eye: Irregular brown macule on sclera  Images     Assessment & Plan   Skin cancer screening performed today.  Actinic Damage - diffuse scaly erythematous macules with underlying dyspigmentation - Recommend daily broad spectrum sunscreen SPF 30+ to sun-exposed areas, reapply every 2 hours as needed.  - Call for new or changing lesions.  Purpura - Violaceous macules and patches arms - Benign - Related to age, sun damage and/or use of blood thinners - Observe - Can use OTC arnica containing moisturizer such as Dermend Bruise Formula if desired - Call for worsening or other concerns   Epidermal inclusion cyst L upper forehead  Cyst with symptoms and/or recent change.  Discussed surgical excision to remove, including resulting scar and possible recurrence.  Patient will schedule for surgery. Pre-op information given.   Inflamed seborrheic keratosis (4) Forehead  vs HyAK Cryotherapy x 4 forehead  Destruction of lesion - Forehead  Destruction method: cryotherapy   Informed consent: discussed and consent obtained   Lesion destroyed using liquid nitrogen: Yes   Region  frozen until ice ball extended beyond lesion: Yes   Outcome: patient tolerated procedure well with no complications   Post-procedure details: wound care instructions given    Lentigines Left Eye  Irregular pigmented lesion.  Will refer to [facility] for consultation.  Other Related Procedures Ambulatory referral to Ophthalmology   Seborrheic Keratoses - Stuck-on, waxy, tan-brown papules and plaques trunk - Discussed benign etiology and prognosis. - Observe - Call for any changes  Melanocytic Nevi - Tan-brown and/or pink-flesh-colored symmetric macules and papules trunk, scalp - Benign appearing on exam today - Observation - Call clinic for new or changing moles - Recommend daily use of broad spectrum spf 30+ sunscreen to sun-exposed areas.   Hemangiomas - Red papules - Discussed benign nature - Observe - Call for any changes   Return for surgery - cyst of the left upper forehead.   IJamesetta Orleans, CMA, am acting as scribe for Brendolyn Patty, MD .  Documentation: I have reviewed the above documentation for accuracy and completeness, and I agree with the above.  Brendolyn Patty MD

## 2020-02-18 NOTE — Patient Instructions (Signed)
Pre-Operative Instructions  You are scheduled for a surgical procedure at [facility]. We recommend you read the following instructions. If you have any questions or concerns, please call the office at [phone].  1. Shower and wash the entire body with soap and water the day of your surgery paying special attention to cleansing at and around the planned surgery site.  2. Avoid aspirin or aspirin containing products at least fourteen (14) days prior to your surgical procedure and for at least one week (7 Days) after your surgical procedure. If you take aspirin on a regular basis for heart disease or history of stroke or for any other reason, we may recommend you continue taking aspirin but please notify us if you take this on a regular basis. Aspirin can cause more bleeding to occur during surgery as well as prolonged bleeding and bruising after surgery.   3. Avoid other nonsteroidal pain medications at least one week prior to surgery and at least one week prior to your surgery. These include medications such as Ibuprofen (Motrin, Advil and Nuprin), Naprosyn, Voltaren, Relafen, etc. If medications are used for therapeutic reasons, please inform us as they can cause increased bleeding or prolonged bleeding during and bruising after surgical procedures.   4. Please advice [place] if you are taking any "blood thinner" medications such as Coumadin or Dipyridamole or Plavix or similar medications. These cause increased bleeding and prolonged bleeding during and bruising after surgical procedures. We may have to consider discontinuing these medications briefly prior to and shortly after your surgery, if safe to do so.   5. Please inform us of all medications you are currently taking. All medications that are taken regularly should be taken the day of surgery as you always do. Nevertheless, we need to be informed of what medications you are taking prior to surgery to whether they will affect the  procedure or cause any complications.   6. Please inform us of any medication allergies. Also inform us of whether you have allergies to Latex or rubber products or whether you have had any adverse reaction to Lidocaine or Epinephrine.  7. Please inform us of any prosthetic or artificial body parts such as artificial heart valve, joint replacements, etc., or similar condition that might require preoperative antibiotics.   8. We recommend avoidance of alcohol at least two weeks prior to surgery and continued avoidence for at least two weeks after surgery.   9. We recommend discontinuation of tobacco smoking at least two weeks prior to surgery and continued abstinence for at least two weeks after surgery.  10. Do not plan strenuous exercise, strenuous work or strenuous lifting for approximately four weeks after your surgery.   11. We request if you are unable to make your scheduled surgical appointment, please call us at least a week in advance or as soon as you are aware of a problem sot aht we can cancel or reschedule you.   12. You MAKE TAKE TYLENOL (acetaminophen) for pain as it is not a blood thinner.   13. PLEASE PLAN TO BE IN TOWN FOR TWO WEEKS FOLLOWING SURGERY, THIS IS IMPORTANT SO YOU CAN BE CHECKED FOR DRESSING CHANGES, SUTURE REMOVAL AND TO MONITOR FOR POSSIBLE COMPLICATIONS.  14.

## 2020-02-24 ENCOUNTER — Telehealth: Payer: Self-pay

## 2020-02-24 DIAGNOSIS — R634 Abnormal weight loss: Secondary | ICD-10-CM

## 2020-02-24 NOTE — Telephone Encounter (Signed)
Per Dr. Vicente Males Colonoscopy appointment patient needs CT scan of chest, abdominal and pelvis in 2 weeks. Called patient and he states he would like this done and he can do it at any time. Called central scheduling and got patient scheduled for 03/08/2020 arrived at 1:00pm for a 1:30pm scan at outpatient imaging. Nothing to eat or drink 4 hours before. Pick up contrast before the scan. Informed patient wife of this information her and the patient verbalized understanding

## 2020-03-02 ENCOUNTER — Other Ambulatory Visit: Payer: Self-pay

## 2020-03-02 ENCOUNTER — Ambulatory Visit (INDEPENDENT_AMBULATORY_CARE_PROVIDER_SITE_OTHER): Payer: Medicare Other

## 2020-03-02 DIAGNOSIS — E538 Deficiency of other specified B group vitamins: Secondary | ICD-10-CM

## 2020-03-02 MED ORDER — CYANOCOBALAMIN 1000 MCG/ML IJ SOLN
1000.0000 ug | Freq: Once | INTRAMUSCULAR | Status: AC
  Administered 2020-03-02: 1000 ug via INTRAMUSCULAR

## 2020-03-02 NOTE — Progress Notes (Signed)
Patient presented for B 12 injection to left deltoid, patient voiced no concerns nor showed any signs of distress during injection. 

## 2020-03-03 ENCOUNTER — Ambulatory Visit (INDEPENDENT_AMBULATORY_CARE_PROVIDER_SITE_OTHER): Payer: Medicare Other

## 2020-03-03 VITALS — Ht 70.0 in | Wt 151.0 lb

## 2020-03-03 DIAGNOSIS — Z0000 Encounter for general adult medical examination without abnormal findings: Secondary | ICD-10-CM

## 2020-03-03 NOTE — Progress Notes (Signed)
Subjective:   Eric Morris is a 79 y.o. male who presents for Medicare Annual/Subsequent preventive examination.  Review of Systems    No ROS.  Medicare Wellness Virtual Visit.    Cardiac Risk Factors include: advanced age (>76mn, >>57women);hypertension;male gender     Objective:    Today's Vitals   03/03/20 1308  Weight: 151 lb (68.5 kg)  Height: _0  (1.778 m)   Body mass index is 21.67 kg/m.  Advanced Directives 03/03/2020 02/12/2020 03/03/2019 12/05/2018 02/07/2018 04/12/2016  Does Patient Have a Medical Advance Directive? _1  No  Would patient like information on creating a medical advance directive? No - Patient declined - No - Patient declined No - Patient declined No - Patient declined No - patient declined information    Current Medications (verified) Outpatient Encounter Medications as of 03/03/2020  Medication Sig  . aspirin EC 325 MG EC tablet Take 1 tablet (325 mg total) by mouth daily. (Patient not taking: Reported on 02/18/2020)  . blood glucose meter kit and supplies Dispense based on patient and insurance preference. Use daily as directed. (FOR ICD-10 E10.9, E11.9). 1 year supply of lancets and strips  . glipiZIDE (GLUCOTROL XL) 2.5 MG 24 hr tablet Take 1 tablet (2.5 mg total) by mouth daily with breakfast.  . metFORMIN (GLUCOPHAGE) 500 MG tablet Take 1 tablet (500 mg total) by mouth 2 (two) times daily with a meal.  . polyethylene glycol (GOLYTELY) 236 g solution Drink 8 oz every 20-30 minutes until entire prep is finished (Patient not taking: Reported on 02/18/2020)  . pravastatin (PRAVACHOL) 20 MG tablet Take 1 tablet (20 mg total) by mouth at bedtime.   No facility-administered encounter medications on file as of 03/03/2020.    Allergies (verified) Patient has no known allergies.   History: Past Medical History:  Diagnosis Date  . Arthritis   . Diabetes mellitus without complication ([facility]   . Pain    RIGHT SHOULDER   . Retinopathy  04/19/2018   No diabetic Retinopathy  . Stroke ([facility]    TIA 2017  . TIA (transient ischemic attack)    Past Surgical History:  Procedure Laterality Date  . BACK SURGERY    . CATARACT EXTRACTION W/PHACO Right 08/22/2018   Procedure: CATARACT EXTRACTION PHACO AND INTRAOCULAR LENS PLACEMENT (IOC)-RIGHT;  Surgeon: HMarchia Meiers MD;  Location: [facility] ORS;  Service: Ophthalmology;  Laterality: Right;  [place]02:18.0CDE 24.65Fluid Pack Lot # [id number]  . CATARACT EXTRACTION W/PHACO Left 12/05/2018   Procedure: CATARACT EXTRACTION PHACO AND INTRAOCULAR LENS PLACEMENT (IOC) LEFT, DIABETIC;  Surgeon: HMarchia Meiers MD;  Location: [facility] ORS;  Service: Ophthalmology;  Laterality: Left;  [place]00:55.8 AP% 12.2 CDE 8.10 Fluid Pack Lot # [id number]  . COLONOSCOPY WITH PROPOFOL N/A 02/12/2020   Procedure: COLONOSCOPY WITH PROPOFOL;  Surgeon: AJonathon Bellows MD;  [facility];  Service: Gastroenterology;  Laterality: N/A;  . ESOPHAGOGASTRODUODENOSCOPY (EGD) WITH PROPOFOL N/A 02/12/2020   Procedure: ESOPHAGOGASTRODUODENOSCOPY (EGD) WITH PROPOFOL;  Surgeon: AJonathon Bellows MD;  [facility];  Service: Gastroenterology;  Laterality: N/A;  . HERNIA REPAIR     Family History  Problem Relation Age of Onset  . Cancer Mother        ?type  . Cancer Father        ?type  . Cancer Brother        ?GB cancer    Social History   Socioeconomic History  . Marital status: Married    Spouse name: Not  on file  . Number of children: Not on file  . Years of education: Not on file  . Highest education level: Not on file  Occupational History  . Not on file  Tobacco Use  . Smoking status: Former Smoker    Packs/day: 1.50    Years: 15.00    Pack years: 22.50    Types: Cigarettes    Quit date: 06/19/2005    Years since quitting: 14.7  . Smokeless tobacco: Current User    Types: Chew  Vaping Use  . Vaping Use: Never used  Substance and Sexual Activity  . Alcohol use: Yes    Alcohol/week: 4.0 - 5.0 standard  drinks    Types: 4 - 5 Cans of beer per week    Comment: OCCAS  . Drug use: Never  . Sexual activity: Not Currently    Partners: Female  Other Topics Concern  . 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 Difficulty of Paying Living Expenses: Not hard at all  Food Insecurity: No Food Insecurity  . Worried About Charity fundraiser in the Last Year: Never true  . Ran Out of Food in the Last Year: Never true  Transportation Needs: No Transportation Needs  . Lack of Transportation (Medical): No  . Lack of Transportation (Non-Medical): No  Physical Activity:   . Days of Exercise per Week: Not on file  . Minutes of Exercise per Session: Not on file  Stress: No Stress Concern Present  . Feeling of Stress : Not at all  Social Connections: Unknown  . Frequency of Communication with Friends and Family: Not on file  . Frequency of Social Gatherings with Friends and Family: Not on file  . Attends Religious Services: Not on file  . Active Member of Clubs or Organizations: Not on file  . Attends Archivist Meetings: Not on file  . Marital Status: Married    Tobacco Counseling Ready to quit: Not Answered Counseling given: Not Answered   Clinical Intake:  Pre-visit preparation completed: Yes        Diabetes: Yes (Followed by pcp)  How often do you need to have someone help you when you read instructions, pamphlets, or other written materials from your doctor or pharmacy?: 1 - Never   Interpreter Needed?: No      Activities of Daily Living In your present state of health, do you have any difficulty performing the following activities: 03/03/2020  Hearing? N  Vision? N   Difficulty concentrating or making decisions? N  Walking or climbing stairs? N  Dressing or bathing? N  Doing errands, shopping? N  Preparing Food and eating ? N  Using the Toilet? N  In the past six months, have you accidently leaked urine? N  Do you have problems with loss of bowel control? N  Managing your Medications? N  Managing your Finances? Y  Comment Wife assists  Housekeeping or managing your Housekeeping? N  Some recent data might be hidden    Patient Care Team: McLean-Scocuzza, Nino Glow, MD  as PCP - General (Internal Medicine)  Indicate any recent Medical Services you may have received from other than [facility] providers in the past year (date may be approximate).     Assessment:   This is a routine wellness examination for Khadir.  I connected with Misael today by telephone and verified that I am speaking with the correct person using two identifiers. Location patient: home Location provider: work Persons participating in the virtual visit: patient, Marine scientist.    I discussed the limitations, risks, security and privacy concerns of performing an evaluation and management service by telephone and the availability of in person appointments. The patient expressed understanding and verbally consented to this telephonic visit.    Interactive audio and video telecommunications were attempted between this provider and patient, however failed, due to patient having technical difficulties OR patient did not have access to video capability.  We continued and completed visit with audio only.  Some vital signs may be absent or patient reported.   Hearing/Vision screen  Hearing Screening   _0  _1  _2  _3  _4  _5  _6  _7  _8   Right ear:           Left ear:           Comments: Patient is able to hear conversational tones without difficulty.  No issues reported.  Vision Screening Comments: Cataract extraction, bilateral Visual acuity not assessed, virtual visit.    They have seen their ophthalmologist.     Dietary issues and exercise activities discussed: Current Exercise Habits: Home exercise routine, Type of exercise: walking, Intensity: Mild  Goals    . Follow up with Primary Care Provider     As needed      Depression Screen PHQ 2/9 Scores 03/03/2020 01/27/2020 12/30/2019 07/11/2019 03/03/2019 03/07/2018  PHQ - 2 Score 0 0 0 0 0 0    Fall Risk Fall Risk  03/03/2020 01/27/2020 12/30/2019 07/11/2019 03/03/2019  Falls in the past year? 0 0 0 0 0  Number falls in past yr: 0 0 0 - -  Injury with Fall? - 0 0 - -  Follow up Falls evaluation completed Falls evaluation completed Falls evaluation completed - -   Handrails in use when climbing stairs? Yes Home free of loose throw rugs in walkways, pet beds, electrical cords, etc? Yes  Adequate lighting in your home to reduce risk of falls? Yes   ASSISTIVE DEVICES UTILIZED TO PREVENT FALLS: Life alert? No  Use of a cane, walker or w/c? No  Grab bars in the bathroom? No  Shower chair or bench in shower? No  Elevated toilet seat or a handicapped toilet? No   TIMED UP AND GO:  Was the test performed? No . Virtual visit.   Cognitive Function: Patient is alert and oriented x3.  Denies difficulty with focusing, making decisions, memory loss.     6CIT Screen 03/03/2020 03/03/2019  What Year? 0 points 0 points  What month? 3 points 0 points  What time? 0 points 0 points  Count back from 20 0 points 0 points  Months in reverse 2 points 0 points  Repeat phrase - 0 points  Total Score - 0    Immunizations Immunization History  Administered Date(s) Administered  . Fluad Quad(high Dose 65+) 03/13/2019  . Influenza, High Dose Seasonal PF 03/07/2018  . Influenza,inj,Quad PF,6+ Mos 04/14/2016  . Influenza-Unspecified 05/19/2017  . PFIZER SARS-COV-2 Vaccination 01/03/2020, 01/24/2020  . Pneumococcal Polysaccharide-23 04/14/2016   Health Maintenance Health Maintenance  Topic Date Due  .  OPHTHALMOLOGY EXAM  Never done  . INFLUENZA VACCINE  09/16/2020 (Originally 01/18/2020)  . TETANUS/TDAP  03/03/2021 (Originally 12/20/1959)  . PNA vac Low Risk Adult (2 of 2 - PCV13) 03/03/2021 (Originally 04/14/2017)  . HEMOGLOBIN A1C  07/01/2020  . FOOT EXAM  07/10/2020  . URINE MICROALBUMIN  12/29/2020  . COLONOSCOPY  02/12/2023  . COVID-19 Vaccine  Completed  . Hepatitis C Screening  Completed   Pneumococcal vaccine- deferred per patient.   Tdap- deferred per patient.   Influenza vaccine- plans to receive later in the season. Deferred.   Dental Screening: Recommended annual dental exams for proper oral hygiene. Dentures.   Community Resource Referral / Chronic Care Management: CRR required this visit?  No   CCM required this visit?  No      Plan:   Keep all routine maintenance appointments.   Follow up 04/06/20 @ 9:30  Next scheduled lab 06/01/20 @ 8:30  Follow up nurse visit B12 injection @ 9:00  I have personally reviewed and noted the following in the patient's chart:   . Medical and social history . Use of alcohol, tobacco or illicit drugs  . Current medications and supplements . Functional ability and status . Nutritional status . Physical activity . Advanced directives . List of other physicians . Hospitalizations, surgeries, and ER visits in previous 12 months . Vitals . Screenings to include cognitive, depression, and falls . Referrals and appointments  In addition, I have reviewed and discussed with patient certain preventive protocols, quality metrics, and best practice recommendations. A written personalized care plan for preventive services as well as general preventive health recommendations were provided to patient via mail.     Varney Biles, LPN   [id number]

## 2020-03-03 NOTE — Patient Instructions (Addendum)
Eric Morris , Thank you for taking time to come for your Medicare Wellness Visit. I appreciate your ongoing commitment to your health goals. Please review the following plan we discussed and let me know if I can assist you in the future.   These are the goals we discussed: Goals    . Follow up with Primary Care Provider     As needed       This is a list of the screening recommended for you and due dates:  Health Maintenance  Topic Date Due  . Eye exam for diabetics  Never done  . Flu Shot  09/16/2020*  . Tetanus Vaccine  03/03/2021*  . Pneumonia vaccines (2 of 2 - PCV13) 03/03/2021*  . Hemoglobin A1C  07/01/2020  . Complete foot exam   07/10/2020  . Urine Protein Check  12/29/2020  . Colon Cancer Screening  02/12/2023  . COVID-19 Vaccine  Completed  .  Hepatitis C: One time screening is recommended by Center for Disease Control  (CDC) for  adults born from [birth year] through 1964.   Completed  *Topic was postponed. The date shown is not the original due date.    Immunizations Immunization History  Administered Date(s) Administered  . Fluad Quad(high Dose 65+) 03/13/2019  . Influenza, High Dose Seasonal PF 03/07/2018  . Influenza,inj,Quad PF,6+ Mos 04/14/2016  . Influenza-Unspecified 05/19/2017  . PFIZER SARS-COV-2 Vaccination 01/03/2020, 01/24/2020  . Pneumococcal Polysaccharide-23 04/14/2016    Keep all routine maintenance appointments.   Follow up 04/06/20 @ 9:30  Next scheduled lab 06/01/20 @ 8:30  Follow up nurse visit B12 injection @ 9:00  Advanced directives: End of life planning; Advance aging; Advanced directives discussed.  Copy of current HCPOA/Living Will requested.    Conditions/risks identified: none new.   Follow up in one year for your annual wellness visit.   Preventive Care 78 Years and Older, Male Preventive care refers to lifestyle choices and visits with your health care provider that can promote health and wellness. What does preventive care  include?  A yearly physical exam. This is also called an annual well check.  Dental exams once or twice a year.  Routine eye exams. Ask your health care provider how often you should have your eyes checked.  Personal lifestyle choices, including:  Daily care of your teeth and gums.  Regular physical activity.  Eating a healthy diet.  Avoiding tobacco and drug use.  Limiting alcohol use.  Practicing safe sex.  Taking low doses of aspirin every day.  Taking vitamin and mineral supplements as recommended by your health care provider. What happens during an annual well check? The services and screenings done by your health care provider during your annual well check will depend on your age, overall health, lifestyle risk factors, and family history of disease. Counseling  Your health care provider may ask you questions about your:  Alcohol use.  Tobacco use.  Drug use.  Emotional well-being.  Home and relationship well-being.  Sexual activity.  Eating habits.  History of falls.  Memory and ability to understand (cognition).  Work and work Statistician. Screening  You may have the following tests or measurements:  Height, weight, and BMI.  Blood pressure.  Lipid and cholesterol levels. These may be checked every 5 years, or more frequently if you are over 89 years old.  Skin check.  Lung cancer screening. You may have this screening every year starting at age 14 if you have a 30-pack-year history of smoking and  currently smoke or have quit within the past 15 years.  Fecal occult blood test (FOBT) of the stool. You may have this test every year starting at age 58.  Flexible sigmoidoscopy or colonoscopy. You may have a sigmoidoscopy every 5 years or a colonoscopy every 10 years starting at age 78.  Prostate cancer screening. Recommendations will vary depending on your family history and other risks.  Hepatitis C blood test.  Hepatitis B blood  test.  Sexually transmitted disease (STD) testing.  Diabetes screening. This is done by checking your blood sugar (glucose) after you have not eaten for a while (fasting). You may have this done every 1-3 years.  Abdominal aortic aneurysm (AAA) screening. You may need this if you are a current or former smoker.  Osteoporosis. You may be screened starting at age 78 if you are at high risk. Talk with your health care provider about your test results, treatment options, and if necessary, the need for more tests. Vaccines  Your health care provider may recommend certain vaccines, such as:  Influenza vaccine. This is recommended every year.  Tetanus, diphtheria, and acellular pertussis (Tdap, Td) vaccine. You may need a Td booster every 10 years.  Zoster vaccine. You may need this after age 78.  Pneumococcal 13-valent conjugate (PCV13) vaccine. One dose is recommended after age 78.  Pneumococcal polysaccharide (PPSV23) vaccine. One dose is recommended after age 78. Talk to your health care provider about which screenings and vaccines you need and how often you need them. This information is not intended to replace advice given to you by your health care provider. Make sure you discuss any questions you have with your health care provider. Document Released: 07/02/2015 Document Revised: 02/23/2016 Document Reviewed: 04/06/2015 Elsevier Interactive Patient Education  2017 [place] Prevention in the Home Falls can cause injuries. They can happen to people of all ages. There are many things you can do to make your home safe and to help prevent falls. What can I do on the outside of my home?  Regularly fix the edges of walkways and driveways and fix any cracks.  Remove anything that might make you trip as you walk through a door, such as a raised step or threshold.  Trim any bushes or trees on the path to your home.  Use bright outdoor lighting.  Clear any walking paths of  anything that might make someone trip, such as rocks or tools.  Regularly check to see if handrails are loose or broken. Make sure that both sides of any steps have handrails.  Any raised decks and porches should have guardrails on the edges.  Have any leaves, snow, or ice cleared regularly.  Use sand or salt on walking paths during winter.  Clean up any spills in your garage right away. This includes oil or grease spills. What can I do in the bathroom?  Use night lights.  Install grab bars by the toilet and in the tub and shower. Do not use towel bars as grab bars.  Use non-skid mats or decals in the tub or shower.  If you need to sit down in the shower, use a plastic, non-slip stool.  Keep the floor dry. Clean up any water that spills on the floor as soon as it happens.  Remove soap buildup in the tub or shower regularly.  Attach bath mats securely with double-sided non-slip rug tape.  Do not have throw rugs and other things on the floor that can make  you trip. What can I do in the bedroom?  Use night lights.  Make sure that you have a light by your bed that is easy to reach.  Do not use any sheets or blankets that are too big for your bed. They should not hang down onto the floor.  Have a firm chair that has side arms. You can use this for support while you get dressed.  Do not have throw rugs and other things on the floor that can make you trip. What can I do in the kitchen?  Clean up any spills right away.  Avoid walking on wet floors.  Keep items that you use a lot in easy-to-reach places.  If you need to reach something above you, use a strong step stool that has a grab bar.  Keep electrical cords out of the way.  Do not use floor polish or wax that makes floors slippery. If you must use wax, use non-skid floor wax.  Do not have throw rugs and other things on the floor that can make you trip. What can I do with my stairs?  Do not leave any items on the  stairs.  Make sure that there are handrails on both sides of the stairs and use them. Fix handrails that are broken or loose. Make sure that handrails are as long as the stairways.  Check any carpeting to make sure that it is firmly attached to the stairs. Fix any carpet that is loose or worn.  Avoid having throw rugs at the top or bottom of the stairs. If you do have throw rugs, attach them to the floor with carpet tape.  Make sure that you have a light switch at the top of the stairs and the bottom of the stairs. If you do not have them, ask someone to add them for you. What else can I do to help prevent falls?  Wear shoes that:  Do not have high heels.  Have rubber bottoms.  Are comfortable and fit you well.  Are closed at the toe. Do not wear sandals.  If you use a stepladder:  Make sure that it is fully opened. Do not climb a closed stepladder.  Make sure that both sides of the stepladder are locked into place.  Ask someone to hold it for you, if possible.  Clearly mark and make sure that you can see:  Any grab bars or handrails.  First and last steps.  Where the edge of each step is.  Use tools that help you move around (mobility aids) if they are needed. These include:  Canes.  Walkers.  Scooters.  Crutches.  Turn on the lights when you go into a dark area. Replace any light bulbs as soon as they burn out.  Set up your furniture so you have a clear path. Avoid moving your furniture around.  If any of your floors are uneven, fix them.  If there are any pets around you, be aware of where they are.  Review your medicines with your doctor. Some medicines can make you feel dizzy. This can increase your chance of falling. Ask your doctor what other things that you can do to help prevent falls. This information is not intended to replace advice given to you by your health care provider. Make sure you discuss any questions you have with your health care  provider. Document Released: 04/01/2009 Document Revised: 11/11/2015 Document Reviewed: 07/10/2014 Elsevier Interactive Patient Education  2017 [employer].

## 2020-03-04 ENCOUNTER — Telehealth: Payer: Self-pay

## 2020-03-04 DIAGNOSIS — E1165 Type 2 diabetes mellitus with hyperglycemia: Secondary | ICD-10-CM

## 2020-03-04 MED ORDER — BLOOD GLUCOSE TEST VI STRP: 1.0000 | ORAL_STRIP | Freq: Two times a day (BID) | 3 refills | Status: DC

## 2020-03-04 NOTE — Telephone Encounter (Signed)
Patient request refill of strips that accompany blood glucose meter kit supplied. Please assist.

## 2020-03-04 NOTE — Telephone Encounter (Signed)
Confirmed with Patient that he has an Accu Check meter. Strips sent in to preferred pharmacy.

## 2020-03-08 ENCOUNTER — Ambulatory Visit
Admission: RE | Admit: 2020-03-08 | Discharge: 2020-03-08 | Disposition: A | Discharge status: Home / Self Care | Payer: Medicare Other | Source: Ambulatory Visit | Attending: Gastroenterology | Admitting: Gastroenterology

## 2020-03-08 ENCOUNTER — Other Ambulatory Visit: Payer: Self-pay

## 2020-03-08 DIAGNOSIS — N200 Calculus of kidney: Secondary | ICD-10-CM | POA: Insufficient documentation

## 2020-03-08 DIAGNOSIS — I700 Atherosclerosis of aorta: Secondary | ICD-10-CM | POA: Diagnosis not present

## 2020-03-08 DIAGNOSIS — R634 Abnormal weight loss: Secondary | ICD-10-CM | POA: Diagnosis not present

## 2020-03-08 DIAGNOSIS — J439 Emphysema, unspecified: Secondary | ICD-10-CM | POA: Diagnosis not present

## 2020-03-08 DIAGNOSIS — N400 Enlarged prostate without lower urinary tract symptoms: Secondary | ICD-10-CM | POA: Insufficient documentation

## 2020-03-08 DIAGNOSIS — I714 Abdominal aortic aneurysm, without rupture: Secondary | ICD-10-CM | POA: Diagnosis not present

## 2020-03-08 LAB — POCT I-STAT CREATININE: Creatinine, Ser: 1.2 mg/dL (ref 0.61–1.24)

## 2020-03-08 MED ORDER — IOHEXOL 300 MG/ML  SOLN
75.0000 mL | Freq: Once | INTRAMUSCULAR | Status: AC | PRN
  Administered 2020-03-08: 75 mL via INTRAVENOUS

## 2020-03-17 ENCOUNTER — Telehealth: Payer: Self-pay | Admitting: Internal Medicine

## 2020-03-17 ENCOUNTER — Telehealth (INDEPENDENT_AMBULATORY_CARE_PROVIDER_SITE_OTHER): Payer: Medicare Other | Admitting: Gastroenterology

## 2020-03-17 DIAGNOSIS — R634 Abnormal weight loss: Secondary | ICD-10-CM | POA: Diagnosis not present

## 2020-03-17 NOTE — Progress Notes (Signed)
Eric Morris , MD [address]  [place]  [place], [place] [address]  Main: [phone]  Fax: [phone]   Primary Care Physician: Eric Morris, Eric Glow, MD  Virtual Visit via Telephone Note  I connected with patient on 03/17/20 at  1:30 PM EDT by telephone and verified that I am speaking with the correct person using two identifiers.   I discussed the limitations, risks, security and privacy concerns of performing an evaluation and management service by telephone and the availability of in person appointments. I also discussed with the patient that there may be a patient responsible charge related to this service. The patient expressed understanding and agreed to proceed.  Location of Patient: Home Location of Provider: Home Persons involved: Patient and provider only   History of Present Illness:  Follow-up for unintentional weight loss  HPI: Eric Morris is a 79 y.o. male   Summary of history :  Initially referred and seen on 01/07/2020 for unintentional weight loss. 12/30/2019 demonstrated a hemoglobin of 13 g with an MCV of 89.3.  Urinalysis showed no blood.  CMP was normal except for a creatinine of 1.33.  TSH normal.  HbA1c 8.4.  B12 low at 162.  Commenced on B12 shots. Cologuard in February 2021 was asked to be r repeated.  No recent imaging.  He denies any abdominal pain, change in bowel habits, rectal bleeding.  Not on any blood thinners except aspirin.  Interval history   01/07/2020-03/17/2020  02/12/2020: EGD: Normal, colonoscopy on the same day was performed showed 5 subcentimeter polyps that were completely resected.  Biopsies demonstrated 4 adenomas negative for high-grade dysplasia.  Recommended to follow-up in 3 years  CT scan of chest abdomen pelvis with contrast showed no acute findings.  Scattered upper lung predominant tiny pulmonary nodules up to 4 mm.  Prostatomegaly.  Weight stable. Blood sugars : unclear   Current Outpatient Medications    Medication Sig Dispense Refill   aspirin EC 325 MG EC tablet Take 1 tablet (325 mg total) by mouth daily. (Patient not taking: Reported on 02/18/2020) 30 tablet 0   blood glucose meter kit and supplies Dispense based on patient and insurance preference. Use daily as directed. (FOR ICD-10 E10.9, E11.9). 1 year supply of lancets and strips 1 each 0   glipiZIDE (GLUCOTROL XL) 2.5 MG 24 hr tablet Take 1 tablet (2.5 mg total) by mouth daily with breakfast. 90 tablet 3   Glucose Blood (BLOOD GLUCOSE TEST STRIPS) STRP 1 each by Does not apply route in the morning and at bedtime. 200 strip 3   metFORMIN (GLUCOPHAGE) 500 MG tablet Take 1 tablet (500 mg total) by mouth 2 (two) times daily with a meal. 180 tablet 3   polyethylene glycol (GOLYTELY) 236 g solution Drink 8 oz every 20-30 minutes until entire prep is finished (Patient not taking: Reported on 02/18/2020) 4000 mL 0   pravastatin (PRAVACHOL) 20 MG tablet Take 1 tablet (20 mg total) by mouth at bedtime. 90 tablet 3   No current facility-administered medications for this visit.    Allergies as of 03/17/2020   (No Known Allergies)    Review of Systems:    All systems reviewed and negative except where noted in HPI.   Observations/Objective:  Labs: CMP     Component Value Date/Time   NA 138 12/30/2019 1005   K 4.9 12/30/2019 1005   CL 102 12/30/2019 1005   CO2 28 12/30/2019 1005   GLUCOSE 178 (H) 12/30/2019  1005   BUN 25 (H) 12/30/2019 1005   CREATININE 1.20 03/08/2020 1342   CALCIUM 9.5 12/30/2019 1005   PROT 6.9 12/30/2019 1005   ALBUMIN 4.2 12/30/2019 1005   AST 12 12/30/2019 1005   ALT 10 12/30/2019 1005   ALKPHOS 60 12/30/2019 1005   BILITOT 0.4 12/30/2019 1005   GFRNONAA 43 (L) 02/07/2018 1551   GFRAA 50 (L) 02/07/2018 1551   Lab Results  Component Value Date   WBC 6.2 12/30/2019   HGB 13.0 12/30/2019   HCT 40.1 12/30/2019   MCV 89.3 12/30/2019   PLT 339.0 12/30/2019    Imaging Studies: CT CHEST ABDOMEN  PELVIS W CONTRAST  Result Date: 03/09/2020 CLINICAL DATA:  Unintentional weight loss. EXAM: CT CHEST, ABDOMEN, AND PELVIS WITH CONTRAST TECHNIQUE: Multidetector CT imaging of the chest, abdomen and pelvis was performed following the standard protocol during bolus administration of intravenous contrast. CONTRAST:  24m OMNIPAQUE IOHEXOL 300 MG/ML  SOLN COMPARISON:  None. FINDINGS: CT CHEST FINDINGS Cardiovascular: The heart size is normal. No substantial pericardial effusion. Coronary artery calcification is evident. Atherosclerotic calcification is noted in the wall of the thoracic aorta. Mediastinum/Nodes: No mediastinal lymphadenopathy. There is no hilar lymphadenopathy. The esophagus has normal imaging features. There is no axillary lymphadenopathy. Lungs/Pleura: Mucus/secretions are seen in the trachea and right mainstem bronchus. Centrilobular emphsyema noted. Symmetric biapical pleuroparenchymal scarring evident. Scattered upper lung predominant tiny pulmonary nodules are identified bilaterally measuring up to maximum size of 4 mm. No overtly suspicious nodule or mass. No focal airspace consolidation. No pleural effusion. Musculoskeletal: No worrisome lytic or sclerotic osseous abnormality. CT ABDOMEN PELVIS FINDINGS Hepatobiliary: No suspicious focal abnormality within the liver parenchyma. There is no evidence for gallstones, gallbladder wall thickening, or pericholecystic fluid. No intrahepatic or extrahepatic biliary dilation. Pancreas: No focal mass lesion. No dilatation of the main duct. No intraparenchymal cyst. No peripancreatic edema. Spleen: No splenomegaly. No focal mass lesion. Adrenals/Urinary Tract: No adrenal nodule or mass. 2 mm nonobstructing stone identified lower pole right kidney (coronal 68/4). Small central sinus cyst noted right kidney. 7 mm low-density lesion upper pole left kidney is too small to characterize but likely benign. No evidence for hydroureter. The urinary bladder appears  normal for the degree of distention. Stomach/Bowel: Stomach is unremarkable. No gastric wall thickening. No evidence of outlet obstruction. Duodenum is normally positioned as is the ligament of Treitz. Duodenal diverticulum noted. No small bowel wall thickening. No small bowel dilatation. The terminal ileum is normal. The appendix is normal. No gross colonic mass. No colonic wall thickening. Vascular/Lymphatic: Atherosclerotic calcification noted abdominal aorta with fusiform aneurysmal dilatation of the infrarenal segment up to 3.4 cm maximum diameter. Upper normal lymph nodes identified in the hepatoduodenal ligament. There is no gastrohepatic ligament lymphadenopathy. No retroperitoneal or mesenteric lymphadenopathy. No pelvic sidewall lymphadenopathy. Reproductive: Prostate gland is enlarged. Other: No intraperitoneal free fluid. Musculoskeletal: No worrisome lytic or sclerotic osseous abnormality. IMPRESSION: 1. No acute findings in the chest, abdomen, or pelvis. Specifically, no findings to explain the patient's history of weight loss. 2. Scattered upper lung predominant tiny pulmonary nodules measuring up to 4 mm. No follow-up needed if patient is low-risk (and has no known or suspected primary neoplasm). Non-contrast chest CT can be considered in 12 months if patient is high-risk. This recommendation follows the consensus statement: Guidelines for Management of Incidental Pulmonary Nodules Detected on CT Images: From the Fleischner Society 2017; Radiology 2017; 284:228-243. 3. 2 mm nonobstructing right renal stone. 4. Prostatomegaly. 5. Aortic Atherosclerosis (  ICD10-I70.0) and Emphysema (ICD10-J43.9). Electronically Signed   By: Misty Stanley M.D.   On: 03/09/2020 08:09    Assessment and Plan:   Eric Morris is a 80 y.o. y/o male here to follow-up for unintentional weight loss which he was referred for in July 2021.  EGD and colonoscopy demonstrated no contributing etiology.  He was noted to have  very low B12 levels and is on supplementation.  Pulmonary nodules seen on CT scan.  He states that his weight has been stable.  GI evaluation has been completed.  Suggest to monitor weight closely.  Plan :   1.  Pulmonary nodules follow-up with Dr. Terese Door, Eric Glow, MD  2.  No further GI input.  If has further weight loss suggest to refer to oncology and may be need to consider PET scan at that time.  Suggest good glycemic control as hyperglycemia can lead to weight loss, consider high-protein diet see a dietitian. 3.  Prostatomegaly.  Suggest Dr Eric Morris, Eric Glow, MD to follow-up and decide if it needs further evaluation  Follow-up as needed    I discussed the assessment and treatment plan with the patient. The patient was provided an opportunity to ask questions and all were answered. The patient agreed with the plan and demonstrated an understanding of the instructions.   The patient was advised to call back or seek an in-person evaluation if the symptoms worsen or if the condition fails to improve as anticipated.  I provided 11 minutes of non-face-to-face time during this encounter.  Dr Eric Bellows MD,MRCP [facility]) Gastroenterology/Hepatology Pager: [phone]   Speech recognition software was used to dictate this note.

## 2020-03-17 NOTE — Telephone Encounter (Signed)
sch 05/28/20 follow up for weight loss  Fasting am appt if able  Thanks Woodlyn

## 2020-03-22 ENCOUNTER — Ambulatory Visit: Payer: Medicare Other | Admitting: Gastroenterology

## 2020-03-29 LAB — HEMOGLOBIN A1C: Hemoglobin A1C: 6.9

## 2020-04-05 ENCOUNTER — Telehealth: Payer: Self-pay

## 2020-04-05 ENCOUNTER — Encounter: Payer: Self-pay | Admitting: Internal Medicine

## 2020-04-05 ENCOUNTER — Encounter: Payer: Medicare Other | Admitting: Dermatology

## 2020-04-05 DIAGNOSIS — R918 Other nonspecific abnormal finding of lung field: Secondary | ICD-10-CM | POA: Insufficient documentation

## 2020-04-05 DIAGNOSIS — I700 Atherosclerosis of aorta: Secondary | ICD-10-CM | POA: Insufficient documentation

## 2020-04-05 DIAGNOSIS — J432 Centrilobular emphysema: Secondary | ICD-10-CM | POA: Insufficient documentation

## 2020-04-05 DIAGNOSIS — N200 Calculus of kidney: Secondary | ICD-10-CM | POA: Insufficient documentation

## 2020-04-05 NOTE — Telephone Encounter (Signed)
-----   Message from Jonathon Bellows, MD sent at 04/05/2020  1:11 PM EDT ----- Sherald Hess inform : no gross lesions on CT to explain weight loss. Close follow up with Dr Terese Door, Nino Glow, MD , if worsens may need to see oncology , ?? PET scan . Some lung nodules seen which would require follow up with Dr McLean-Scocuzza, Nino Glow, MD   Dr Jonathon Bellows MD,MRCP [facility]) Gastroenterology/Hepatology Pager: [phone]

## 2020-04-05 NOTE — Telephone Encounter (Signed)
Pt has been notified of results and Dr. Anna's recommendations. 

## 2020-04-06 ENCOUNTER — Other Ambulatory Visit: Payer: Self-pay

## 2020-04-06 ENCOUNTER — Ambulatory Visit (INDEPENDENT_AMBULATORY_CARE_PROVIDER_SITE_OTHER): Payer: Medicare Other

## 2020-04-06 DIAGNOSIS — E538 Deficiency of other specified B group vitamins: Secondary | ICD-10-CM

## 2020-04-06 MED ORDER — CYANOCOBALAMIN 1000 MCG/ML IJ SOLN
1000.0000 ug | Freq: Once | INTRAMUSCULAR | Status: AC
  Administered 2020-04-06: 1000 ug via INTRAMUSCULAR

## 2020-04-06 NOTE — Progress Notes (Signed)
Patient presented for B 12 injection to right deltoid, patient voiced no concerns nor showed any signs of distress during injection. 

## 2020-04-09 DIAGNOSIS — H11132 Conjunctival pigmentations, left eye: Secondary | ICD-10-CM | POA: Diagnosis not present

## 2020-04-09 LAB — HM DIABETES EYE EXAM

## 2020-04-18 ENCOUNTER — Encounter: Payer: Self-pay | Admitting: Internal Medicine

## 2020-05-28 ENCOUNTER — Ambulatory Visit (INDEPENDENT_AMBULATORY_CARE_PROVIDER_SITE_OTHER): Payer: Medicare Other

## 2020-05-28 ENCOUNTER — Ambulatory Visit (INDEPENDENT_AMBULATORY_CARE_PROVIDER_SITE_OTHER): Payer: Medicare Other | Admitting: Internal Medicine

## 2020-05-28 ENCOUNTER — Encounter: Payer: Self-pay | Admitting: Internal Medicine

## 2020-05-28 ENCOUNTER — Other Ambulatory Visit: Payer: Self-pay

## 2020-05-28 VITALS — BP 126/76 | HR 61 | Temp 97.9°F | Ht 70.0 in | Wt 152.0 lb

## 2020-05-28 DIAGNOSIS — E1159 Type 2 diabetes mellitus with other circulatory complications: Secondary | ICD-10-CM

## 2020-05-28 DIAGNOSIS — G8929 Other chronic pain: Secondary | ICD-10-CM | POA: Insufficient documentation

## 2020-05-28 DIAGNOSIS — R338 Other retention of urine: Secondary | ICD-10-CM

## 2020-05-28 DIAGNOSIS — I152 Hypertension secondary to endocrine disorders: Secondary | ICD-10-CM

## 2020-05-28 DIAGNOSIS — N401 Enlarged prostate with lower urinary tract symptoms: Secondary | ICD-10-CM

## 2020-05-28 DIAGNOSIS — M25561 Pain in right knee: Secondary | ICD-10-CM

## 2020-05-28 DIAGNOSIS — Z23 Encounter for immunization: Secondary | ICD-10-CM | POA: Diagnosis not present

## 2020-05-28 DIAGNOSIS — E119 Type 2 diabetes mellitus without complications: Secondary | ICD-10-CM

## 2020-05-28 DIAGNOSIS — I739 Peripheral vascular disease, unspecified: Secondary | ICD-10-CM

## 2020-05-28 DIAGNOSIS — E1165 Type 2 diabetes mellitus with hyperglycemia: Secondary | ICD-10-CM | POA: Diagnosis not present

## 2020-05-28 MED ORDER — METFORMIN HCL 500 MG PO TABS: 500.0000 mg | ORAL_TABLET | Freq: Two times a day (BID) | ORAL | 3 refills | Status: DC

## 2020-05-28 MED ORDER — TAMSULOSIN HCL 0.4 MG PO CAPS: 0.4000 mg | ORAL_CAPSULE | Freq: Every day | ORAL | 3 refills | Status: DC

## 2020-05-28 MED ORDER — BLOOD GLUCOSE TEST VI STRP: 1.0000 | ORAL_STRIP | Freq: Two times a day (BID) | 3 refills | Status: DC

## 2020-05-28 MED ORDER — ASPIRIN 325 MG PO TBEC: 325.0000 mg | DELAYED_RELEASE_TABLET | Freq: Every day | ORAL | 3 refills | Status: DC

## 2020-05-28 MED ORDER — GLIPIZIDE ER 2.5 MG PO TB24: 2.5000 mg | ORAL_TABLET | Freq: Every day | ORAL | 3 refills | Status: DC

## 2020-05-28 MED ORDER — ACCU-CHEK SOFTCLIX LANCETS MISC: 3 refills | Status: AC

## 2020-05-28 NOTE — Patient Instructions (Addendum)
Tylenol for knee pain arthritis tylenol  voltaren gel 4x per day  Consider vascular surgery for circulation problems noted on home tests    Consider pfizer 3rd dose 07/26/20 at pharmacy   Call to schedule eye doctor appt [facility] 10/08/20   [place] sugar free Carb controlled ice cream     Knee Exercises Ask your health care provider which exercises are safe for you. Do exercises exactly as told by your health care provider and adjust them as directed. It is normal to feel mild stretching, pulling, tightness, or discomfort as you do these exercises. Stop right away if you feel sudden pain or your pain gets worse. Do not begin these exercises until told by your health care provider. Stretching and range-of-motion exercises These exercises warm up your muscles and joints and improve the movement and flexibility of your knee. These exercises also help to relieve pain and swelling. Knee extension, prone 1. Lie on your abdomen (prone position) on a bed. 2. Place your left / right knee just beyond the edge of the surface so your knee is not on the bed. You can put a towel under your left / right thigh just above your kneecap for comfort. 3. Relax your leg muscles and allow gravity to straighten your knee (extension). You should feel a stretch behind your left / right knee. 4. Hold this position for __________ seconds. 5. Scoot up so your knee is supported between repetitions. Repeat __________ times. Complete this exercise __________ times a day. Knee flexion, active  1. Lie on your back with both legs straight. If this causes back discomfort, bend your left / right knee so your foot is flat on the floor. 2. Slowly slide your left / right heel back toward your buttocks. Stop when you feel a gentle stretch in the front of your knee or thigh (flexion). 3. Hold this position for __________ seconds. 4. Slowly slide your left / right heel back to the starting position. Repeat __________ times.  Complete this exercise __________ times a day. Quadriceps stretch, prone  1. Lie on your abdomen on a firm surface, such as a bed or padded floor. 2. Bend your left / right knee and hold your ankle. If you cannot reach your ankle or pant leg, loop a belt around your foot and grab the belt instead. 3. Gently pull your heel toward your buttocks. Your knee should not slide out to the side. You should feel a stretch in the front of your thigh and knee (quadriceps). 4. Hold this position for __________ seconds. Repeat __________ times. Complete this exercise __________ times a day. Hamstring, supine 1. Lie on your back (supine position). 2. Loop a belt or towel over the ball of your left / right foot. The ball of your foot is on the walking surface, right under your toes. 3. Straighten your left / right knee and slowly pull on the belt to raise your leg until you feel a gentle stretch behind your knee (hamstring). ? Do not let your knee bend while you do this. ? Keep your other leg flat on the floor. 4. Hold this position for __________ seconds. Repeat __________ times. Complete this exercise __________ times a day. Strengthening exercises These exercises build strength and endurance in your knee. Endurance is the ability to use your muscles for a long time, even after they get tired. Quadriceps, isometric This exercise stretches the muscles in front of your thigh (quadriceps) without moving your knee joint (isometric). 1. Eric Morris  on your back with your left / right leg extended and your other knee bent. Put a rolled towel or small pillow under your knee if told by your health care provider. 2. Slowly tense the muscles in the front of your left / right thigh. You should see your kneecap slide up toward your hip or see increased dimpling just above the knee. This motion will push the back of the knee toward the floor. 3. For __________ seconds, hold the muscle as tight as you can without increasing your  pain. 4. Relax the muscles slowly and completely. Repeat __________ times. Complete this exercise __________ times a day. Straight leg raises This exercise stretches the muscles in front of your thigh (quadriceps) and the muscles that move your hips (hip flexors). 1. Lie on your back with your left / right leg extended and your other knee bent. 2. Tense the muscles in the front of your left / right thigh. You should see your kneecap slide up or see increased dimpling just above the knee. Your thigh may even shake a bit. 3. Keep these muscles tight as you raise your leg 4-6 inches (10-15 cm) off the floor. Do not let your knee bend. 4. Hold this position for __________ seconds. 5. Keep these muscles tense as you lower your leg. 6. Relax your muscles slowly and completely after each repetition. Repeat __________ times. Complete this exercise __________ times a day. Hamstring, isometric 1. Lie on your back on a firm surface. 2. Bend your left / right knee about __________ degrees. 3. Dig your left / right heel into the surface as if you are trying to pull it toward your buttocks. Tighten the muscles in the back of your thighs (hamstring) to "dig" as hard as you can without increasing any pain. 4. Hold this position for __________ seconds. 5. Release the tension gradually and allow your muscles to relax completely for __________ seconds after each repetition. Repeat __________ times. Complete this exercise __________ times a day. Hamstring curls If told by your health care provider, do this exercise while wearing ankle weights. Begin with __________ lb weights. Then increase the weight by 1 lb (0.5 kg) increments. Do not wear ankle weights that are more than __________ lb. 1. Lie on your abdomen with your legs straight. 2. Bend your left / right knee as far as you can without feeling pain. Keep your hips flat against the floor. 3. Hold this position for __________ seconds. 4. Slowly lower your  leg to the starting position. Repeat __________ times. Complete this exercise __________ times a day. Squats This exercise strengthens the muscles in front of your thigh and knee (quadriceps). 1. Stand in front of a table, with your feet and knees pointing straight ahead. You may rest your hands on the table for balance but not for support. 2. Slowly bend your knees and lower your hips like you are going to sit in a chair. ? Keep your weight over your heels, not over your toes. ? Keep your lower legs upright so they are parallel with the table legs. ? Do not let your hips go lower than your knees. ? Do not bend lower than told by your health care provider. ? If your knee pain increases, do not bend as low. 3. Hold the squat position for __________ seconds. 4. Slowly push with your legs to return to standing. Do not use your hands to pull yourself to standing. Repeat __________ times. Complete this exercise __________ times a  day. Wall slides This exercise strengthens the muscles in front of your thigh and knee (quadriceps). 1. Lean your back against a smooth wall or door, and walk your feet out 18-24 inches (46-61 cm) from it. 2. Place your feet hip-width apart. 3. Slowly slide down the wall or door until your knees bend __________ degrees. Keep your knees over your heels, not over your toes. Keep your knees in line with your hips. 4. Hold this position for __________ seconds. Repeat __________ times. Complete this exercise __________ times a day. Straight leg raises This exercise strengthens the muscles that rotate the leg at the hip and move it away from your body (hip abductors). 1. Lie on your side with your left / right leg in the top position. Lie so your head, shoulder, knee, and hip line up. You may bend your bottom knee to help you keep your balance. 2. Roll your hips slightly forward so your hips are stacked directly over each other and your left / right knee is facing  forward. 3. Leading with your heel, lift your top leg 4-6 inches (10-15 cm). You should feel the muscles in your outer hip lifting. ? Do not let your foot drift forward. ? Do not let your knee roll toward the ceiling. 4. Hold this position for __________ seconds. 5. Slowly return your leg to the starting position. 6. Let your muscles relax completely after each repetition. Repeat __________ times. Complete this exercise __________ times a day. Straight leg raises This exercise stretches the muscles that move your hips away from the front of the pelvis (hip extensors). 1. Lie on your abdomen on a firm surface. You can put a pillow under your hips if that is more comfortable. 2. Tense the muscles in your buttocks and lift your left / right leg about 4-6 inches (10-15 cm). Keep your knee straight as you lift your leg. 3. Hold this position for __________ seconds. 4. Slowly lower your leg to the starting position. 5. Let your leg relax completely after each repetition. Repeat __________ times. Complete this exercise __________ times a day. This information is not intended to replace advice given to you by your health care provider. Make sure you discuss any questions you have with your health care provider. Document Revised: 03/26/2018 Document Reviewed: 03/26/2018 Elsevier Patient Education  2020 [employer].

## 2020-05-28 NOTE — Progress Notes (Addendum)
Chief Complaint  Patient presents with  . Follow-up  . Weight Loss  . Immunizations   F/u  1. Right knee pain mild to moderate at times and rad to upper thigh and right leg pain with walking and knee stiff mild to moderating nothing tried  PAD noted home insurance visit 0.6 right and 0.34 left 2. Weight gain home scale 151 lbs and last was 144 lbs EGD/colonosocpy +polyps  3. HTN with DM 03/29/20 A1C 6.9  4. BPH at times not sure if emptying bladder with enlarged prostate   Review of Systems  Constitutional: Negative for weight loss.  HENT: Negative for hearing loss.   Eyes: Negative for blurred vision.  Respiratory: Negative for shortness of breath.   Cardiovascular: Negative for chest pain.  Gastrointestinal: Negative for abdominal pain.  Musculoskeletal: Positive for joint pain.  Skin: Negative for rash.  Neurological: Negative for headaches.  Psychiatric/Behavioral: Negative for depression.   Past Medical History:  Diagnosis Date  . Arthritis   . Diabetes mellitus without complication ([place])   . Pain    RIGHT SHOULDER   . Retinopathy 04/19/2018   No diabetic Retinopathy  . Stroke [facility])    TIA 2017  . TIA (transient ischemic attack)    Past Surgical History:  Procedure Laterality Date  . BACK SURGERY    . CATARACT EXTRACTION W/PHACO Right 08/22/2018   Procedure: CATARACT EXTRACTION PHACO AND INTRAOCULAR LENS PLACEMENT (IOC)-RIGHT;  Surgeon: Marchia Meiers, MD;  Location: [facility] ORS;  Service: Ophthalmology;  Laterality: Right;  [place] 02:18.0CDE 24.65Fluid Pack Lot # [id number]  . CATARACT EXTRACTION W/PHACO Left 12/05/2018   Procedure: CATARACT EXTRACTION PHACO AND INTRAOCULAR LENS PLACEMENT (IOC) LEFT, DIABETIC;  Surgeon: Marchia Meiers, MD;  Location: [facility] ORS;  Service: Ophthalmology;  Laterality: Left;  [place] 00:55.8 AP% 12.2 CDE 8.10 Fluid Pack Lot # [id number]  . COLONOSCOPY WITH PROPOFOL N/A 02/12/2020   Procedure: COLONOSCOPY WITH PROPOFOL;  Surgeon: Jonathon Bellows, MD;  Location:  [facility] ENDOSCOPY;  Service: Gastroenterology;  Laterality: N/A;  . ESOPHAGOGASTRODUODENOSCOPY (EGD) WITH PROPOFOL N/A 02/12/2020   Procedure: ESOPHAGOGASTRODUODENOSCOPY (EGD) WITH PROPOFOL;  Surgeon: Jonathon Bellows, MD;  Location: [facility] ENDOSCOPY;  Service: Gastroenterology;  Laterality: N/A;  . HERNIA REPAIR     Family History  Problem Relation Age of Onset  . Cancer Mother        ?type  . Cancer Father        ?type  . Cancer Brother        ?GB cancer    Social History   Socioeconomic History  . Marital status: Married    Spouse name: Not on file  . Number of children: Not on file  . Years of education: Not on file  . Highest education level: Not on file  Occupational History  . Not on file  Tobacco Use  . Smoking status: Former Smoker    Packs/day: 1.50    Years: 15.00    Pack years: 22.50    Types: Cigarettes    Quit date: 06/19/2005    Years since quitting: 14.9  . Smokeless tobacco: Current User    Types: Chew  Vaping Use  . Vaping Use: Never used  Substance and Sexual Activity  . Alcohol use: Yes    Alcohol/week: 4.0 - 5.0 standard drinks    Types: 4 - 5 Cans of beer per week    Comment: OCCAS  . Drug use: Never  . Sexual activity: Not Currently    Partners: Female  Other Topics  Concern  . 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 Difficulty of Paying Living Expenses: Not hard at all  Food Insecurity: No Food Insecurity  . Worried About Charity fundraiser in the Last Year: Never true  . Ran Out of Food in the Last Year: Never true  Transportation Needs: No Transportation Needs  . Lack of Transportation (Medical): No  . Lack of Transportation (Non-Medical): No   Physical Activity: Not on file  Stress: No Stress Concern Present  . Feeling of Stress : Not at all  Social Connections: Unknown  . Frequency of Communication with Friends and Family: Not on file  . Frequency of Social Gatherings with Friends and Family: Not on file  . Attends Religious Services: Not on file  . Active Member of Clubs or Organizations: Not on file  . Attends Archivist Meetings: Not on file  . Marital Status: Married  Human resources officer Violence: Not At Risk  . Fear of Current or Ex-Partner: No  . Emotionally Abused: No  . Physically Abused: No  . Sexually Abused: No   Current Meds  Medication Sig  . blood glucose meter kit and supplies Dispense based on patient and insurance preference. Use daily as directed. (FOR ICD-10 E10.9, E11.9). 1 year supply of lancets and strips  . [DISCONTINUED] aspirin EC 325 MG EC tablet Take 1 tablet (325 mg total) by mouth daily.  . [DISCONTINUED] glipiZIDE (GLUCOTROL XL) 2.5 MG 24 hr tablet Take 1 tablet (2.5 mg total) by mouth daily with breakfast.  . [DISCONTINUED] Glucose Blood (BLOOD GLUCOSE TEST STRIPS) STRP 1 each by Does not apply route in the morning and at bedtime.  . [DISCONTINUED] metFORMIN (GLUCOPHAGE) 500 MG tablet Take 1 tablet (500 mg total) by mouth 2 (two) times daily with a meal.   No Known Allergies Recent Results (from the past 2160 hour(s))  I-STAT creatinine     Status: None   Collection Time: 03/08/20  1:42 PM  Result Value Ref Range   Creatinine, Ser 1.20 0.61 - 1.24 mg/dL  Hemoglobin A1c     Status: None   Collection Time: 03/29/20 12:00 AM  Result Value Ref Range   Hemoglobin A1C 6.9   HM DIABETES EYE EXAM     Status: None   Collection Time: 04/09/20 12:00 AM  Result Value Ref Range   HM Diabetic Eye Exam No Retinopathy No Retinopathy    Comment: AE 04/12/20   Objective  Body mass index is 21.81 kg/m. Wt Readings from Last 3 Encounters:  05/28/20 152 lb (68.9 kg)  03/03/20 151 lb (68.5  kg)  02/12/20 151 lb (68.5 kg)   Temp Readings from Last 3 Encounters:  05/28/20 97.9 F (36.6 C) (Oral)  02/12/20 (!) 97 F (36.1 C) (Temporal)  01/27/20 97.7 F (36.5 C) (Oral)   BP Readings from Last 3 Encounters:  05/28/20 126/76  02/12/20 (!) 145/80  01/27/20 110/76   Pulse Readings from Last 3 Encounters:  05/28/20 61  02/12/20 (!) 54  01/27/20 (!) 54  Physical Exam Vitals and nursing note reviewed.  Constitutional:      Appearance: Normal appearance. He is well-developed and well-groomed.  HENT:     Head: Normocephalic and atraumatic.  Eyes:     Conjunctiva/sclera: Conjunctivae normal.     Pupils: Pupils are equal, round, and reactive to light.  Cardiovascular:     Rate and Rhythm: Normal rate and regular rhythm.     Heart sounds: Normal heart sounds. No murmur heard.   Pulmonary:     Effort: Pulmonary effort is normal.     Breath sounds: Normal breath sounds.  Skin:    General: Skin is warm and dry.  Neurological:     General: No focal deficit present.     Mental Status: He is alert and oriented to person, place, and time. Mental status is at baseline.     Gait: Gait normal.  Psychiatric:        Attention and Perception: Attention and perception normal.        Mood and Affect: Mood and affect normal.        Speech: Speech normal.        Behavior: Behavior normal. Behavior is cooperative.        Thought Content: Thought content normal.        Cognition and Memory: Cognition and memory normal.        Judgment: Judgment normal.     Assessment  Plan  Chronic pain of right knee - Plan: DG Knee Complete 4 Views Right  Hypertension associated with diabetes ([place]) - Plan: glipiZIDE (GLUCOTROL XL) 2.5 MG 24 hr tablet, aspirin 325 MG EC tablet, Accu-Chek Softclix Lancets lancets  metFORMIN (GLUCOPHAGE) 500 MG tablet, Accu-Chek Softclix Lancets lancets 03/29/20 A1C 6.9   PAD (peripheral artery disease) (H[facility]) Wants to hold on vascular for now   Benign  prostatic hyperplasia with urinary retention - Plan: tamsulosin (FLOMAX) 0.4 MG CAPS capsule  Prn Dr. Rogers Blocker prn   HM Flu shot utd today pna 23 had 04/14/16 Consider Tdap, shingrix, prevnar  covid 2/2 pfizer 3rd due 07/27/19   cologuard 08/04/19 + sch colonoscopy 4/5 tubular adenomas 1 hyperplastic f/u in 3 years Dr. Vicente Males  Elevated PSA 5.21 elevated saw Dr. Rogers Blocker 01/16/20 exosome study and consider MRI/bx in future  As of 05/28/20 Dr. Rogers Blocker urology  I ordered both studies, however he has chosen not to pursue this.   Skin referred dermatology Ak changes and ? nmsc to left arm esp.   -seen 02/27/20   [facility] referred for clavi to feetdid not see yet   Former smoker since age 34 y.o max 3 ppd quit in 2009 or 2011. No FH lung cancer  -consider calc CT risk score to see if qualifies CT chest   04/09/20 [place] eyef/u in 6 months   Hep C neg  CT chest ab pelvis 03/08/20  IMPRESSION: 1. No acute findings in the chest, abdomen, or pelvis. Specifically, no findings to explain the patient's history of weight loss. 2. Scattered upper lung predominant tiny pulmonary nodules measuring up to 4 mm. No follow-up needed if patient is low-risk (and has no known or suspected primary neoplasm). Non-contrast chest CT can be considered in 12 months if patient is high-risk. This recommendation follows the consensus statement: Guidelines for Management of Incidental Pulmonary Nodules Detected on CT Images: From the Fleischner Society 2017; Radiology 2017; 284:228-243. 3. 2 mm nonobstructing right renal stone. 4. Prostatomegaly. 5. Aortic Atherosclerosis (ICD10-I70.0) and Emphysema (ICD10-J43.9).  03/09/2020 08:09-->consider repeat  CT chest in 02/2021   Was smoker 5-6 years 1.5 ppd   Provider: Dr. Olivia Mackie McLean-Scocuzza-Internal Medicine

## 2020-06-01 ENCOUNTER — Ambulatory Visit: Payer: Medicare Other

## 2020-06-01 ENCOUNTER — Other Ambulatory Visit: Payer: Medicare Other

## 2020-09-27 ENCOUNTER — Other Ambulatory Visit: Payer: Medicare Other

## 2020-09-29 ENCOUNTER — Ambulatory Visit: Payer: Medicare Other | Admitting: Internal Medicine

## 2020-10-11 ENCOUNTER — Other Ambulatory Visit: Payer: Medicare Other

## 2020-10-12 ENCOUNTER — Ambulatory Visit: Payer: Medicare Other | Admitting: Internal Medicine

## 2020-10-25 ENCOUNTER — Telehealth: Payer: Self-pay

## 2020-10-25 DIAGNOSIS — E119 Type 2 diabetes mellitus without complications: Secondary | ICD-10-CM

## 2020-10-25 MED ORDER — METFORMIN HCL 500 MG PO TABS: 500.0000 mg | ORAL_TABLET | Freq: Two times a day (BID) | ORAL | 3 refills | Status: AC

## 2020-10-25 NOTE — Telephone Encounter (Signed)
Pt is completely out of  metFORMIN (GLUCOPHAGE) 500 MG tablet. Please send to [facility] in [place]

## 2020-10-26 ENCOUNTER — Other Ambulatory Visit: Payer: Self-pay

## 2020-10-26 ENCOUNTER — Other Ambulatory Visit (INDEPENDENT_AMBULATORY_CARE_PROVIDER_SITE_OTHER): Payer: Medicare Other

## 2020-10-26 DIAGNOSIS — E1165 Type 2 diabetes mellitus with hyperglycemia: Secondary | ICD-10-CM

## 2020-10-26 LAB — COMPREHENSIVE METABOLIC PANEL
ALT: 8 U/L (ref 0–53)
AST: 11 U/L (ref 0–37)
Albumin: 3.9 g/dL (ref 3.5–5.2)
Alkaline Phosphatase: 60 U/L (ref 39–117)
BUN: 20 mg/dL (ref 6–23)
CO2: 30 mEq/L (ref 19–32)
Calcium: 8.9 mg/dL (ref 8.4–10.5)
Chloride: 105 mEq/L (ref 96–112)
Creatinine, Ser: 1.24 mg/dL (ref 0.40–1.50)
GFR: 55.56 mL/min — ABNORMAL LOW (ref >60.00)
Glucose, Bld: 169 mg/dL — ABNORMAL HIGH (ref 70–99)
Potassium: 4.3 mEq/L (ref 3.5–5.1)
Sodium: 141 mEq/L (ref 135–145)
Total Bilirubin: 0.3 mg/dL (ref 0.2–1.2)
Total Protein: 6.3 g/dL (ref 6.0–8.3)

## 2020-10-26 LAB — LIPID PANEL
Cholesterol: 192 mg/dL (ref 0–200)
HDL: 40.8 mg/dL (ref >39.00)
LDL Cholesterol: 115 mg/dL — ABNORMAL HIGH (ref 0–99)
NonHDL: 151.01
Total CHOL/HDL Ratio: 5
Triglycerides: 178 mg/dL — ABNORMAL HIGH (ref 0.0–149.0)
VLDL: 35.6 mg/dL (ref 0.0–40.0)

## 2020-10-26 LAB — HEMOGLOBIN A1C: Hgb A1c MFr Bld: 8.4 % — ABNORMAL HIGH (ref 4.6–6.5)

## 2020-10-28 ENCOUNTER — Encounter: Payer: Self-pay | Admitting: Internal Medicine

## 2020-10-28 ENCOUNTER — Ambulatory Visit (INDEPENDENT_AMBULATORY_CARE_PROVIDER_SITE_OTHER): Payer: Medicare Other | Admitting: Internal Medicine

## 2020-10-28 ENCOUNTER — Other Ambulatory Visit: Payer: Self-pay

## 2020-10-28 VITALS — BP 140/74 | HR 60 | Temp 97.8°F | Ht 70.0 in | Wt 145.8 lb

## 2020-10-28 DIAGNOSIS — N401 Enlarged prostate with lower urinary tract symptoms: Secondary | ICD-10-CM

## 2020-10-28 DIAGNOSIS — Z8673 Personal history of transient ischemic attack (TIA), and cerebral infarction without residual deficits: Secondary | ICD-10-CM

## 2020-10-28 DIAGNOSIS — E1159 Type 2 diabetes mellitus with other circulatory complications: Secondary | ICD-10-CM

## 2020-10-28 DIAGNOSIS — I152 Hypertension secondary to endocrine disorders: Secondary | ICD-10-CM

## 2020-10-28 DIAGNOSIS — E1165 Type 2 diabetes mellitus with hyperglycemia: Secondary | ICD-10-CM

## 2020-10-28 DIAGNOSIS — Z9114 Patient's other noncompliance with medication regimen: Secondary | ICD-10-CM

## 2020-10-28 DIAGNOSIS — R338 Other retention of urine: Secondary | ICD-10-CM | POA: Diagnosis not present

## 2020-10-28 DIAGNOSIS — E119 Type 2 diabetes mellitus without complications: Secondary | ICD-10-CM

## 2020-10-28 DIAGNOSIS — E785 Hyperlipidemia, unspecified: Secondary | ICD-10-CM

## 2020-10-28 MED ORDER — ASPIRIN 325 MG PO TBEC: 325.0000 mg | DELAYED_RELEASE_TABLET | Freq: Every day | ORAL | 3 refills | Status: AC

## 2020-10-28 MED ORDER — BLOOD GLUCOSE TEST VI STRP
1.0000 | ORAL_STRIP | Freq: Two times a day (BID) | 3 refills | Status: AC
Start: 2020-10-28 — End: ?

## 2020-10-28 MED ORDER — GLIPIZIDE ER 2.5 MG PO TB24: 2.5000 mg | ORAL_TABLET | Freq: Every day | ORAL | 3 refills | Status: AC

## 2020-10-28 MED ORDER — PRAVASTATIN SODIUM 20 MG PO TABS: 20.0000 mg | ORAL_TABLET | Freq: Every day | ORAL | 3 refills | Status: AC

## 2020-10-28 MED ORDER — TAMSULOSIN HCL 0.4 MG PO CAPS
0.4000 mg | ORAL_CAPSULE | Freq: Every day | ORAL | 3 refills | Status: AC
Start: 2020-10-28 — End: ?

## 2020-10-28 NOTE — Progress Notes (Signed)
Chief Complaint  Patient presents with  . Follow-up   F/u  1. Wife has been in the hospital x 1 month with health related issues  2. Clavi to fee and h/o diabetes will refer to podiatry  3. DM 2 uncontrolled A1C 10/26/20 8.4 with note from pharmacy pt noncompliant with meds on glipizide 2.5 mg xl qd and metformin 500 mg bid. He relies on restaurant food for meals HLD agreeable to start statin low dose Results for AZAREL, BANNER (MRN [id number]) as of 11/01/2020 11:34  10/26/2020 09:56 Cholesterol: 192 HDL Cholesterol: 40.80 LDL (calc): 115 (H) NonHDL: 151.01 Triglycerides: 178.0 (H) VLDL: 35.6  4. BPH stopped taking flomax 0.4 but agreeable to resume   Review of Systems  Constitutional:       Down 7 lbs in 5 months   HENT: Negative for hearing loss.   Eyes: Negative for blurred vision.  Respiratory: Negative for shortness of breath.   Cardiovascular: Negative for chest pain.  Gastrointestinal: Negative for abdominal pain.  Musculoskeletal: Negative for joint pain.  Skin: Negative for rash.  Neurological: Negative for headaches.  Psychiatric/Behavioral: Positive for memory loss.   Past Medical History:  Diagnosis Date  . Arthritis   . Diabetes mellitus without complication ([place])   . Pain    RIGHT SHOULDER   . Retinopathy 04/19/2018   No diabetic Retinopathy  . Stroke [facility])    TIA 2017  . TIA (transient ischemic attack)    Past Surgical History:  Procedure Laterality Date  . BACK SURGERY    . CATARACT EXTRACTION W/PHACO Right 08/22/2018   Procedure: CATARACT EXTRACTION PHACO AND INTRAOCULAR LENS PLACEMENT (IOC)-RIGHT;  Surgeon: Marchia Meiers, MD;  Location: [facility] ORS;  Service: Ophthalmology;  Laterality: Right;  [place] 02:18.0CDE 24.65Fluid Pack Lot # [id number]  . CATARACT EXTRACTION W/PHACO Left 12/05/2018   Procedure: CATARACT EXTRACTION PHACO AND INTRAOCULAR LENS PLACEMENT (IOC) LEFT, DIABETIC;  Surgeon: Marchia Meiers, MD;  Location: [facility] ORS;  Service: Ophthalmology;   Laterality: Left;  [place] 00:55.8 AP% 12.2 CDE 8.10 Fluid Pack Lot # [id number]  . COLONOSCOPY WITH PROPOFOL N/A 02/12/2020   Procedure: COLONOSCOPY WITH PROPOFOL;  Surgeon: Jonathon Bellows, MD;  Location: [facility] ENDOSCOPY;  Service: Gastroenterology;  Laterality: N/A;  . ESOPHAGOGASTRODUODENOSCOPY (EGD) WITH PROPOFOL N/A 02/12/2020   Procedure: ESOPHAGOGASTRODUODENOSCOPY (EGD) WITH PROPOFOL;  Surgeon: Jonathon Bellows, MD;  Location: [facility] ENDOSCOPY;  Service: Gastroenterology;  Laterality: N/A;  . HERNIA REPAIR     Family History  Problem Relation Age of Onset  . Cancer Mother        ?type  . Cancer Father        ?type  . Cancer Brother        ?GB cancer    Social History   Socioeconomic History  . Marital status: Married    Spouse name: Not on file  . Number of children: Not on file  . Years of education: Not on file  . Highest education level: Not on file  Occupational History  . Not on file  Tobacco Use  . Smoking status: Former Smoker    Packs/day: 1.50    Years: 15.00    Pack years: 22.50    Types: Cigarettes    Quit date: 06/19/2005    Years since quitting: 15.3  . Smokeless tobacco: Current User    Types: Chew  Vaping Use  . Vaping Use: Never used  Substance and Sexual Activity  . Alcohol use: Yes    Alcohol/week: 4.0 - 5.0 standard  drinks    Types: 4 - 5 Cans of beer per week    Comment: OCCAS  . Drug use: Never  . Sexual activity: Not Currently    Partners: Female  Other Topics Concern  . 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 Difficulty of Paying Living Expenses: Not hard at all  Food Insecurity: No Food Insecurity  . Worried About Charity fundraiser in the Last  Year: Never true  . Ran Out of Food in the Last Year: Never true  Transportation Needs: No Transportation Needs  . Lack of Transportation (Medical): No  . Lack of Transportation (Non-Medical): No  Physical Activity: Not on file  Stress: No Stress Concern Present  . Feeling of Stress : Not at all  Social Connections: Unknown  . Frequency of Communication with Friends and Family: Not on file  . Frequency of Social Gatherings with Friends and Family: Not on file  . Attends Religious Services: Not on file  . Active Member of Clubs or Organizations: Not on file  . Attends Archivist Meetings: Not on file  . Marital Status: Married  Human resources officer Violence: Not At Risk  . Fear of Current or Ex-Partner: No  . Emotionally Abused: No  . Physically Abused: No  . Sexually Abused: No   Current Meds  Medication Sig  . Accu-Chek Softclix Lancets lancets Bid Use as instructed E 11.59  . blood glucose meter kit and supplies Dispense based on patient and insurance preference. Use daily as directed. (FOR ICD-10 E10.9, E11.9). 1 year supply of lancets and strips  . metFORMIN (GLUCOPHAGE) 500 MG tablet Take 1 tablet (500 mg total) by mouth 2 (two) times daily with a meal.  . pravastatin (PRAVACHOL) 20 MG tablet Take 1 tablet (20 mg total) by mouth at bedtime.  . [DISCONTINUED] glipiZIDE (GLUCOTROL XL) 2.5 MG 24 hr tablet Take 1 tablet (2.5 mg total) by mouth daily with breakfast.  . [DISCONTINUED] Glucose Blood (BLOOD GLUCOSE TEST STRIPS) STRP 1 each by Does not apply route in the morning and at bedtime.   No Known Allergies Recent Results (from the past 2160 hour(s))  Hemoglobin A1c     Status: Abnormal   Collection Time: 10/26/20  9:56 AM  Result Value Ref Range   Hgb A1c MFr Bld 8.4 (H) 4.6 - 6.5 %    Comment: Glycemic Control Guidelines for People with Diabetes:Non Diabetic:  <6%Goal of Therapy: <7%Additional Action Suggested:  >8%   Lipid panel     Status: Abnormal   Collection  Time: 10/26/20  9:56 AM  Result Value Ref Range   Cholesterol 192 0 - 200 mg/dL    Comment: ATP III Classification       Desirable:  < 200 mg/dL               Borderline High:  200 - 239 mg/dL          High:  > = 240 mg/dL   Triglycerides 178.0 (H) 0.0 - 149.0 mg/dL    Comment:  Normal:  <150 mg/dLBorderline High:  150 - 199 mg/dL   HDL 40.80 >39.00 mg/dL   VLDL 35.6 0.0 - 40.0 mg/dL   LDL Cholesterol 115 (H) 0 - 99 mg/dL   Total CHOL/HDL Ratio 5     Comment:                Men          Women1/2 Average Risk     3.4          3.3Average Risk          5.0          4.42X Average Risk          9.6          7.13X Average Risk          15.0          11.0                       NonHDL 151.01     Comment: NOTE:  Non-HDL goal should be 30 mg/dL higher than patient's LDL goal (i.e. LDL goal of < 70 mg/dL, would have non-HDL goal of < 100 mg/dL)  Comprehensive metabolic panel     Status: Abnormal   Collection Time: 10/26/20  9:56 AM  Result Value Ref Range   Sodium 141 135 - 145 mEq/L   Potassium 4.3 3.5 - 5.1 mEq/L   Chloride 105 96 - 112 mEq/L   CO2 30 19 - 32 mEq/L   Glucose, Bld 169 (H) 70 - 99 mg/dL   BUN 20 6 - 23 mg/dL   Creatinine, Ser 1.24 0.40 - 1.50 mg/dL   Total Bilirubin 0.3 0.2 - 1.2 mg/dL   Alkaline Phosphatase 60 39 - 117 U/L   AST 11 0 - 37 U/L   ALT 8 0 - 53 U/L   Total Protein 6.3 6.0 - 8.3 g/dL   Albumin 3.9 3.5 - 5.2 g/dL   GFR 55.56 (L) >60.00 mL/min    Comment: Calculated using the CKD-EPI Creatinine Equation (2021)   Calcium 8.9 8.4 - 10.5 mg/dL   Objective  Body mass index is 20.92 kg/m. Wt Readings from Last 3 Encounters:  10/28/20 145 lb 12.8 oz (66.1 kg)  05/28/20 152 lb (68.9 kg)  03/03/20 151 lb (68.5 kg)   Temp Readings from Last 3 Encounters:  10/28/20 97.8 F (36.6 C) (Oral)  05/28/20 97.9 F (36.6 C) (Oral)  02/12/20 (!) 97 F (36.1 C) (Temporal)   BP Readings from Last 3 Encounters:  10/28/20 140/74  05/28/20 126/76  02/12/20 (!) 145/80    Pulse Readings from Last 3 Encounters:  10/28/20 60  05/28/20 61  02/12/20 (!) 54    Physical Exam Vitals and nursing note reviewed.  Constitutional:      Appearance: Normal appearance. He is well-developed and well-groomed.  HENT:     Head: Normocephalic and atraumatic.  Eyes:     Conjunctiva/sclera: Conjunctivae normal.     Pupils: Pupils are equal, round, and reactive to light.  Cardiovascular:     Rate and Rhythm: Normal rate and regular rhythm.     Heart sounds: Normal heart sounds. No murmur heard.   Pulmonary:     Effort: Pulmonary effort is normal.     Breath sounds: Normal breath sounds.  Abdominal:     Tenderness: There is no abdominal tenderness.  Skin:    General: Skin is warm and dry.  Neurological:     General: No focal deficit present.     Mental Status: He is alert and oriented to person, place, and time. Mental status is at baseline.     Gait: Gait normal.  Psychiatric:        Attention and Perception: Attention and perception normal.        Mood and Affect: Mood and affect normal.        Speech: Speech normal.        Behavior: Behavior normal. Behavior is cooperative.        Thought Content: Thought content normal.        Cognition and Memory: Cognition and memory normal.        Judgment: Judgment normal.     Assessment  Plan  Hypertension associated with diabetes ([place]) 10/26/20 A1C 8.4 - Plan: glipiZIDE (GLUCOTROL XL) 2.5 MG 24 hr tablet, aspirin 325 MG EC tablet, pravastatin (PRAVACHOL) 20 MG tablet,metformin 500 mg bid Monitor BP for now ok for age sbp 31 or less dbp <80 Encouraged compliance with meds h/o noncompliance  Ambulatory referral to Podiatry clavi to feet, Ambulatory referral to Ophthalmology AE  Hyperlipidemia, unspecified hyperlipidemia type - Plan: pravastatin (PRAVACHOL) 20 MG tablet  History of stroke - Plan: pravastatin (PRAVACHOL) 20 MG tablet rec compliance with aspirin 325 mg qd   Benign prostatic hyperplasia with  urinary retention - Plan: tamsulosin (FLOMAX) 0.4 MG CAPS capsule   HM Flu shot utd  pna 23 had 04/14/16 Consider Tdap, shingrix, prevnar covid 2/2 pfizer3rd and 4th overdue  cologuard2/15/21 + sch colonoscopy had 02/12/20 4/5 tubular adenomas 1 hyperplastic f/u in 3 years Dr. Carley Hammed elevatedsaw Dr. Rogers Blocker 01/16/20 exosome study and consider MRI/bx in future As of 05/28/20 Dr. Rogers Blocker urology  I ordered both studies, however he has chosen not to pursue this in the past   Skin referred dermatology [place] skin Ak changes and ? nmsc to left arm esp.   -seen 02/27/20   [facility] referred for clavi to feetdid not see yet   Former smoker since age 62 y.o max 3 ppd quit in 2009 or 2011. No FH lung cancer  -consider calc CT risk score to see if qualifies CT chest   04/09/20 [place] eyef/u in 6 months -12 months  Hep C neg  CT chest ab pelvis 03/08/20  IMPRESSION: 1. No acute findings in the chest, abdomen, or pelvis. Specifically, no findings to explain the patient's history of weight loss. 2. Scattered upper lung predominant tiny pulmonary nodules measuring up to 4 mm. No follow-up needed if patient is low-risk (and has no known or suspected primary neoplasm). Non-contrast chest CT can be considered in 12 months if patient is high-risk. This recommendation follows the consensus statement: Guidelines for Management of Incidental Pulmonary Nodules Detected on CT Images: From the Fleischner Society 2017; Radiology 2017; 284:228-243. 3. 2 mm nonobstructing right renal stone. 4. Prostatomegaly. 5. Aortic Atherosclerosis (ICD10-I70.0) and Emphysema (ICD10-J43.9).  03/09/2020 08:09-->consider repeat CT chest in 02/2021  Was smoker 5-6 years 1.5 ppd    Provider: Dr. Olivia Mackie McLean-Scocuzza-Internal Medicine

## 2020-10-28 NOTE — Patient Instructions (Addendum)
Consider 3rd pfizer covid vaccine ask your pharmacy in 5-6 months you will need a 4th as well   Your wife likely needs Physical therapy and occupational therapy she must talk to her doctor about this they can come to your house as long as they wear masks  Stop pepsi if wanted to diet   Premier protein shakes   Always take aspirin for stroke prevent and start cholesterol medication   1. Eye doctor [facility] due 03/2021 they will call you   2. Referred to [facility] for [facility] foot doctor  [phone]  [place]   [facility] for your cyst on face   [phone] Not available [place] [address]    Pravastatin Tablets What is this medicine? PRAVASTATIN (PRA va stat in) is known as a HMG-CoA reductase inhibitor or 'statin'. It lowers the level of cholesterol and triglycerides in the blood. This drug may also reduce the risk of heart attack, stroke, or other health problems in patients with risk factors for heart disease. Diet and lifestyle changes are often used with this drug. This medicine may be used for other purposes; ask your health care provider or pharmacist if you have questions. COMMON BRAND NAME(S): Pravachol What should I tell my health care provider before I take this medicine? They need to know if you have any of these conditions:  diabetes  if you often drink alcohol  history of stroke  kidney disease  liver disease  muscle aches or weakness  thyroid disease  an unusual or allergic reaction to pravastatin, other medicines, foods, dyes, or preservatives  pregnant or trying to get pregnant  breast-feeding How should I use this medicine? Take pravastatin tablets by mouth. Swallow the tablets with a drink of water. Pravastatin can be taken at anytime of the day, with or without food. Follow the directions on the prescription label. Take your doses at regular intervals. Do not take  your medicine more often than directed. Talk to your pediatrician regarding the use of this medicine in children. Special care may be needed. Pravastatin has been used in children as young as 61 years of age. Overdosage: If you think you have taken too much of this medicine contact a poison control center or emergency room at once. NOTE: This medicine is only for you. Do not share this medicine with others. What if I miss a dose? If you miss a dose, take it as soon as you can. If it is almost time for your next dose, take only that dose. Do not take double or extra doses. What may interact with this medicine? This medicine may interact with the following medications:  colchicine  cyclosporine  other medicines for high cholesterol  some antibiotics like azithromycin, clarithromycin, erythromycin, and telithromycin This list may not describe all possible interactions. Give your health care provider a list of all the medicines, herbs, non-prescription drugs, or dietary supplements you use. Also tell them if you smoke, drink alcohol, or use illegal drugs. Some items may interact with your medicine. What should I watch for while using this medicine? Visit your doctor or health care professional for regular check-ups. You may need regular tests to make sure your liver is working properly. Your health care professional may tell you to stop taking this medicine if you develop muscle problems. If your muscle problems do not go away after stopping this medicine, contact your health care professional. Do not  become pregnant while taking this medicine. Women should inform their health care professional if they wish to become pregnant or think they might be pregnant. There is a potential for serious side effects to an unborn child. Talk to your health care professional or pharmacist for more information. Do not breast-feed an infant while taking this medicine. This medicine may affect blood sugar levels. If  you have diabetes, check with your doctor or health care professional before you change your diet or the dose of your diabetic medicine. If you are going to need surgery or other procedure, tell your doctor that you are using this medicine. This drug is only part of a total heart-health program. Your doctor or a dietician can suggest a low-cholesterol and low-fat diet to help. Avoid alcohol and smoking, and keep a proper exercise schedule. This medicine may cause a decrease in Co-Enzyme Q-10. You should make sure that you get enough Co-Enzyme Q-10 while you are taking this medicine. Discuss the foods you eat and the vitamins you take with your health care professional. What side effects may I notice from receiving this medicine? Side effects that you should report to your doctor or health care professional as soon as possible:  allergic reactions like skin rash, itching or hives, swelling of the face, lips, or tongue  dark urine  fever  muscle pain, cramps, or weakness  redness, blistering, peeling or loosening of the skin, including inside the mouth  trouble passing urine or change in the amount of urine  unusually weak or tired  yellowing of the eyes or skin Side effects that usually do not require medical attention (report to your doctor or health care professional if they continue or are bothersome):  gas  headache  heartburn  indigestion  stomach pain This list may not describe all possible side effects. Call your doctor for medical advice about side effects. You may report side effects to FDA at 1-800-FDA-1088. Where should I keep my medicine? Keep out of the reach of children. Store at room temperature between 15 to 30 degrees C (59 to 86 degrees F). Protect from light. Keep container tightly closed. Throw away any unused medicine after the expiration date. NOTE: This sheet is a summary. It may not cover all possible information. If you have questions about this medicine,  talk to your doctor, pharmacist, or health care provider.  2021 Elsevier/Gold Standard (2020-04-18 09:58:03)    Diabetes Mellitus and Nutrition, Adult When you have diabetes, or diabetes mellitus, it is very important to have healthy eating habits because your blood sugar (glucose) levels are greatly affected by what you eat and drink. Eating healthy foods in the right amounts, at about the same times every day, can help you:  Control your blood glucose.  Lower your risk of heart disease.  Improve your blood pressure.  Reach or maintain a healthy weight. What can affect my meal plan? Every person with diabetes is different, and each person has different needs for a meal plan. Your health care provider may recommend that you work with a dietitian to make a meal plan that is best for you. Your meal plan may vary depending on factors such as:  The calories you need.  The medicines you take.  Your weight.  Your blood glucose, blood pressure, and cholesterol levels.  Your activity level.  Other health conditions you have, such as heart or kidney disease. How do carbohydrates affect me? Carbohydrates, also called carbs, affect your blood glucose level more  than any other type of food. Eating carbs naturally raises the amount of glucose in your blood. Carb counting is a method for keeping track of how many carbs you eat. Counting carbs is important to keep your blood glucose at a healthy level, especially if you use insulin or take certain oral diabetes medicines. It is important to know how many carbs you can safely have in each meal. This is different for every person. Your dietitian can help you calculate how many carbs you should have at each meal and for each snack. How does alcohol affect me? Alcohol can cause a sudden decrease in blood glucose (hypoglycemia), especially if you use insulin or take certain oral diabetes medicines. Hypoglycemia can be a life-threatening condition.  Symptoms of hypoglycemia, such as sleepiness, dizziness, and confusion, are similar to symptoms of having too much alcohol.  Do not drink alcohol if: ? Your health care provider tells you not to drink. ? You are pregnant, may be pregnant, or are planning to become pregnant.  If you drink alcohol: ? Do not drink on an empty stomach. ? Limit how much you use to:  0-1 drink a day for women.  0-2 drinks a day for men. ? Be aware of how much alcohol is in your drink. In the [place] one drink equals one 12 oz bottle of beer (355 mL), one 5 oz glass of wine (148 mL), or one 1 oz glass of hard liquor (44 mL). ? Keep yourself hydrated with water, diet soda, or unsweetened iced tea.  Keep in mind that regular soda, juice, and other mixers may contain a lot of sugar and must be counted as carbs. What are tips for following this plan? Reading food labels  Start by checking the serving size on the "Nutrition Facts" label of packaged foods and drinks. The amount of calories, carbs, fats, and other nutrients listed on the label is based on one serving of the item. Many items contain more than one serving per package.  Check the total grams (g) of carbs in one serving. You can calculate the number of servings of carbs in one serving by dividing the total carbs by 15. For example, if a food has 30 g of total carbs per serving, it would be equal to 2 servings of carbs.  Check the number of grams (g) of saturated fats and trans fats in one serving. Choose foods that have a low amount or none of these fats.  Check the number of milligrams (mg) of salt (sodium) in one serving. Most people should limit total sodium intake to less than 2,300 mg per day.  Always check the nutrition information of foods labeled as "low-fat" or "nonfat." These foods may be higher in added sugar or refined carbs and should be avoided.  Talk to your dietitian to identify your daily goals for nutrients listed on the  label. Shopping  Avoid buying canned, pre-made, or processed foods. These foods tend to be high in fat, sodium, and added sugar.  Shop around the outside edge of the grocery store. This is where you will most often find fresh fruits and vegetables, bulk grains, fresh meats, and fresh dairy. Cooking  Use low-heat cooking methods, such as baking, instead of high-heat cooking methods like deep frying.  Cook using healthy oils, such as olive, canola, or sunflower oil.  Avoid cooking with butter, cream, or high-fat meats. Meal planning  Eat meals and snacks regularly, preferably at the same times every day. Avoid  going long periods of time without eating.  Eat foods that are high in fiber, such as fresh fruits, vegetables, beans, and whole grains. Talk with your dietitian about how many servings of carbs you can eat at each meal.  Eat 4-6 oz (112-168 g) of lean protein each day, such as lean meat, chicken, fish, eggs, or tofu. One ounce (oz) of lean protein is equal to: ? 1 oz (28 g) of meat, chicken, or fish. ? 1 egg. ?  cup (62 g) of tofu.  Eat some foods each day that contain healthy fats, such as avocado, nuts, seeds, and fish.   What foods should I eat? Fruits Berries. Apples. Oranges. Peaches. Apricots. Plums. Grapes. Mango. Papaya. Pomegranate. Kiwi. Cherries. Vegetables Lettuce. Spinach. Leafy greens, including kale, chard, collard greens, and mustard greens. Beets. Cauliflower. Cabbage. Broccoli. Carrots. Green beans. Tomatoes. Peppers. Onions. Cucumbers. Brussels sprouts. Grains Whole grains, such as whole-wheat or whole-grain bread, crackers, tortillas, cereal, and pasta. Unsweetened oatmeal. Quinoa. Brown or wild rice. Meats and other proteins Seafood. Poultry without skin. Lean cuts of poultry and beef. Tofu. Nuts. Seeds. Dairy Low-fat or fat-free dairy products such as milk, yogurt, and cheese. The items listed above may not be a complete list of foods and beverages you  can eat. Contact a dietitian for more information. What foods should I avoid? Fruits Fruits canned with syrup. Vegetables Canned vegetables. Frozen vegetables with butter or cream sauce. Grains Refined white flour and flour products such as bread, pasta, snack foods, and cereals. Avoid all processed foods. Meats and other proteins Fatty cuts of meat. Poultry with skin. Breaded or fried meats. Processed meat. Avoid saturated fats. Dairy Full-fat yogurt, cheese, or milk. Beverages Sweetened drinks, such as soda or iced tea. The items listed above may not be a complete list of foods and beverages you should avoid. Contact a dietitian for more information. Questions to ask a health care provider  Do I need to meet with a diabetes educator?  Do I need to meet with a dietitian?  What number can I call if I have questions?  When are the best times to check my blood glucose? Where to find more information:  American Diabetes Association: diabetes.org  Academy of Nutrition and Dietetics: www.eatright.[employer] of Diabetes and Digestive and Kidney Diseases: [employer]  Association of Diabetes Care and Education Specialists: www.diabeteseducator.org Summary  It is important to have healthy eating habits because your blood sugar (glucose) levels are greatly affected by what you eat and drink.  A healthy meal plan will help you control your blood glucose and maintain a healthy lifestyle.  Your health care provider may recommend that you work with a dietitian to make a meal plan that is best for you.  Keep in mind that carbohydrates (carbs) and alcohol have immediate effects on your blood glucose levels. It is important to count carbs and to use alcohol carefully. This information is not intended to replace advice given to you by your health care provider. Make sure you discuss any questions you have with your health care provider. Document Revised: 05/13/2019  Document Reviewed: 05/13/2019 Elsevier Patient Education  2021 [employer].

## 2020-10-29 ENCOUNTER — Telehealth: Payer: Self-pay | Admitting: Internal Medicine

## 2020-10-29 NOTE — Telephone Encounter (Signed)
"  Rejection Reason - Patient was No Show - PT HAD F/U ON 04 11 22 DID NOT SHOW UP" Eric Morris said on Oct 29, 2020 2:31 PM  Pt was scheduled on 09/28/2020  msg from [facility]

## 2020-11-01 ENCOUNTER — Telehealth: Payer: Self-pay

## 2020-11-01 ENCOUNTER — Encounter: Payer: Self-pay | Admitting: Internal Medicine

## 2020-11-01 DIAGNOSIS — E785 Hyperlipidemia, unspecified: Secondary | ICD-10-CM | POA: Insufficient documentation

## 2020-11-01 DIAGNOSIS — Z8673 Personal history of transient ischemic attack (TIA), and cerebral infarction without residual deficits: Secondary | ICD-10-CM | POA: Insufficient documentation

## 2020-11-01 NOTE — Telephone Encounter (Signed)
Left pt message we were calling to get him scheduled for surgery for cyst on forehead per referral from Dr. Olivia Mackie McLean-Scocuzza.  Dr. Nicole Kindred has evaluated the cyst at last appointment, so we can schedule him for surgery.Eric Morris

## 2021-01-18 ENCOUNTER — Ambulatory Visit: Payer: Medicare Other | Admitting: Podiatry

## 2021-02-15 DIAGNOSIS — I469 Cardiac arrest, cause unspecified: Secondary | ICD-10-CM | POA: Diagnosis not present

## 2021-02-17 DIAGNOSIS — 419620001 Death: Secondary | SNOMED CT | POA: Diagnosis not present

## 2021-02-17 DEATH — deceased

## 2021-02-18 ENCOUNTER — Telehealth: Payer: Self-pay | Admitting: Internal Medicine

## 2021-02-18 NOTE — Telephone Encounter (Signed)
Eric Morris from [employer] [facility] [employer], [phone]. Patient passed away at home from a natural death. Would like to know if you will sign the death certificate.

## 2021-02-18 NOTE — Telephone Encounter (Signed)
I can sign off on this though I need more details. What is the time of death? What were the circumstances of the death? How was he found? Were the police involved?

## 2021-02-22 ENCOUNTER — Telehealth: Payer: Self-pay

## 2021-02-22 NOTE — Telephone Encounter (Signed)
This has been updated.

## 2021-03-04 ENCOUNTER — Ambulatory Visit: Payer: Medicare Other

## 2021-03-04 ENCOUNTER — Telehealth: Payer: Self-pay

## 2021-03-04 NOTE — Telephone Encounter (Signed)
Unable to reach patient for scheduled AWV on preferred number. Attempted x3. Reschedule.

## 2021-05-04 ENCOUNTER — Ambulatory Visit: Payer: Medicare Other | Admitting: Internal Medicine
